# Patient Record
Sex: Female | Born: 1985
Health system: Southern US, Community
[De-identification: ages and names within clinical notes are randomized; demographics above are authoritative.]

## PROBLEM LIST (undated history)

## (undated) DIAGNOSIS — Z8742 Personal history of other diseases of the female genital tract: Secondary | ICD-10-CM

## (undated) DIAGNOSIS — Z4509 Encounter for adjustment and management of other cardiac device: Secondary | ICD-10-CM

## (undated) DIAGNOSIS — Z95818 Presence of other cardiac implants and grafts: Secondary | ICD-10-CM

## (undated) DIAGNOSIS — Z5189 Encounter for other specified aftercare: Secondary | ICD-10-CM

## (undated) DIAGNOSIS — O2240 Hemorrhoids in pregnancy, unspecified trimester: Secondary | ICD-10-CM

## (undated) DIAGNOSIS — I499 Cardiac arrhythmia, unspecified: Secondary | ICD-10-CM

## (undated) DIAGNOSIS — K219 Gastro-esophageal reflux disease without esophagitis: Secondary | ICD-10-CM

## (undated) DIAGNOSIS — F419 Anxiety disorder, unspecified: Secondary | ICD-10-CM

## (undated) DIAGNOSIS — H9190 Unspecified hearing loss, unspecified ear: Secondary | ICD-10-CM

## (undated) DIAGNOSIS — F329 Major depressive disorder, single episode, unspecified: Secondary | ICD-10-CM

## (undated) DIAGNOSIS — F32A Depression, unspecified: Secondary | ICD-10-CM

## (undated) DIAGNOSIS — I341 Nonrheumatic mitral (valve) prolapse: Secondary | ICD-10-CM

## (undated) HISTORY — DX: Nonrheumatic mitral (valve) prolapse: I34.1

---
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
## 2018-03-14 DIAGNOSIS — Z95818 Presence of other cardiac implants and grafts: Secondary | ICD-10-CM

## 2018-03-14 HISTORY — DX: Presence of other cardiac implants and grafts: Z95.818

## 2018-06-20 LAB — OB RESULTS CONSOLE HIV ANTIBODY (ROUTINE TESTING): HIV: NONREACTIVE

## 2018-06-20 LAB — OB RESULTS CONSOLE RUBELLA ANTIBODY, IGM: Rubella: IMMUNE

## 2018-06-20 LAB — OB RESULTS CONSOLE GC/CHLAMYDIA
Chlamydia: NEGATIVE
Gonorrhea: NEGATIVE

## 2018-09-07 LAB — OB RESULTS CONSOLE RPR: RPR: NONREACTIVE

## 2018-10-25 ENCOUNTER — Other Ambulatory Visit: Payer: Self-pay | Admitting: Obstetrics

## 2018-10-26 LAB — OB RESULTS CONSOLE GBS: GBS: NEGATIVE

## 2018-10-31 ENCOUNTER — Encounter (HOSPITAL_COMMUNITY): Payer: Self-pay | Admitting: *Deleted

## 2018-11-05 ENCOUNTER — Encounter (HOSPITAL_COMMUNITY): Payer: Self-pay

## 2018-11-13 ENCOUNTER — Encounter (HOSPITAL_COMMUNITY)
Admission: RE | Admit: 2018-11-13 | Discharge: 2018-11-13 | Disposition: A | Discharge status: Home / Self Care | Payer: 59 | Source: Ambulatory Visit | Attending: Obstetrics | Admitting: Obstetrics

## 2018-11-13 HISTORY — DX: Hemorrhoids in pregnancy, unspecified trimester: O22.40

## 2018-11-13 HISTORY — DX: Anxiety disorder, unspecified: F41.9

## 2018-11-13 HISTORY — DX: Cardiac arrhythmia, unspecified: I49.9

## 2018-11-13 HISTORY — DX: Encounter for other specified aftercare: Z51.89

## 2018-11-13 HISTORY — DX: Unspecified hearing loss, unspecified ear: H91.90

## 2018-11-13 HISTORY — DX: Depression, unspecified: F32.A

## 2018-11-13 HISTORY — DX: Personal history of other diseases of the female genital tract: Z87.42

## 2018-11-13 HISTORY — DX: Major depressive disorder, single episode, unspecified: F32.9

## 2018-11-13 HISTORY — DX: Gastro-esophageal reflux disease without esophagitis: K21.9

## 2018-11-13 LAB — CBC WITH DIFFERENTIAL/PLATELET
Basophils Absolute: 0 10*3/uL (ref 0.0–0.1)
Basophils Relative: 0 %
Eosinophils Absolute: 0.1 10*3/uL (ref 0.0–0.5)
Eosinophils Relative: 1 %
HCT: 38.5 % (ref 36.0–46.0)
Hemoglobin: 12.4 g/dL (ref 12.0–15.0)
Lymphocytes Relative: 23 %
Lymphs Abs: 2.5 10*3/uL (ref 0.7–4.0)
MCH: 30.8 pg (ref 26.0–34.0)
MCHC: 32.2 g/dL (ref 30.0–36.0)
MCV: 95.5 fL (ref 80.0–100.0)
Monocytes Absolute: 0.6 10*3/uL (ref 0.1–1.0)
Monocytes Relative: 5 %
Neutro Abs: 7.5 10*3/uL (ref 1.7–7.7)
Neutrophils Relative %: 71 %
Platelets: 239 10*3/uL (ref 150–400)
RBC: 4.03 MIL/uL (ref 3.87–5.11)
RDW: 13.5 % (ref 11.5–15.5)
WBC: 10.6 10*3/uL — ABNORMAL HIGH (ref 4.0–10.5)
nRBC: 0 % (ref 0.0–0.2)

## 2018-11-13 LAB — TYPE AND SCREEN
ABO/RH(D): O POS
Antibody Screen: NEGATIVE

## 2018-11-13 LAB — ABO/RH: ABO/RH(D): O POS

## 2018-11-13 NOTE — Anesthesia Preprocedure Evaluation (Addendum)
Anesthesia Evaluation  Patient identified by MRN, date of birth, ID band Patient awake    Reviewed: Allergy & Precautions, NPO status , Patient's Chart, lab work & pertinent test results  History of Anesthesia Complications Negative for: history of anesthetic complications  Airway Mallampati: II  TM Distance: >3 FB Neck ROM: Full    Dental  (+) Dental Advisory Given, Chipped,    Pulmonary neg pulmonary ROS,    Pulmonary exam normal breath sounds clear to auscultation       Cardiovascular Normal cardiovascular exam+ dysrhythmias  Rhythm:Regular Rate:Normal  Hx of SVT, has loop recorder in place   Neuro/Psych Anxiety Depression negative neurological ROS     GI/Hepatic Neg liver ROS, GERD  Controlled and Medicated,  Endo/Other  negative endocrine ROS  Renal/GU negative Renal ROS     Musculoskeletal negative musculoskeletal ROS (+)   Abdominal   Peds  Hematology negative hematology ROS (+)   Anesthesia Other Findings Day of surgery medications reviewed with the patient.  Reproductive/Obstetrics (+) Pregnancy Hx of C/S x1                            Anesthesia Physical Anesthesia Plan  ASA: II  Anesthesia Plan: Spinal   Post-op Pain Management:    Induction:   PONV Risk Score and Plan: 2 and Treatment may vary due to age or medical condition, Ondansetron and Dexamethasone  Airway Management Planned: Natural Airway  Additional Equipment:   Intra-op Plan:   Post-operative Plan:   Informed Consent: I have reviewed the patients History and Physical, chart, labs and discussed the procedure including the risks, benefits and alternatives for the proposed anesthesia with the patient or authorized representative who has indicated his/her understanding and acceptance.     Dental advisory given  Plan Discussed with: CRNA  Anesthesia Plan Comments:        Anesthesia Quick  Evaluation

## 2018-11-13 NOTE — Patient Instructions (Signed)
Kelly Haney  11/13/2018   Your procedure is scheduled on:  11/14/2018  Enter through the Main Entrance of [facility] at 1:30 PM.  Pick up the phone at the desk and dial [phone]  Call this number if you have problems the morning of surgery:[phone]  Remember:   Do not eat food:(After Midnight) Desps de medianoche.  Do not drink clear liquids: (6 Hours before arrival) 6 horas ante llegada.  Take these medicines the morning of surgery with A SIP OF WATER: take your zoloft as prescribed and you may take protonix if desired   Do not wear jewelry, make-up or nail polish.  Do not wear lotions, powders, or perfumes. Do not wear deodorant.  Do not shave 48 hours prior to surgery.  Do not bring valuables to the hospital.  [facility] is not   responsible for any belongings or valuables brought to the hospital.  Contacts, dentures or bridgework may not be worn into surgery.  Leave suitcase in the car. After surgery it may be brought to your room.  For patients admitted to the hospital, checkout time is 11:00 AM the day of              discharge.    N/A   Please read over the following fact sheets that you were given:   Surgical Site Infection Prevention

## 2018-11-14 ENCOUNTER — Inpatient Hospital Stay (HOSPITAL_COMMUNITY)
Admission: RE | Admit: 2018-11-14 | Discharge: 2018-11-17 | DRG: 787 | Disposition: A | Discharge status: Home / Self Care | Payer: 59 | Attending: Obstetrics | Admitting: Obstetrics

## 2018-11-14 ENCOUNTER — Inpatient Hospital Stay (HOSPITAL_COMMUNITY): Payer: 59 | Admitting: Anesthesiology

## 2018-11-14 ENCOUNTER — Inpatient Hospital Stay (HOSPITAL_COMMUNITY): Admission: RE | Admit: 2018-11-14 | Payer: 59 | Source: Home / Self Care | Admitting: Obstetrics

## 2018-11-14 ENCOUNTER — Encounter (HOSPITAL_COMMUNITY): Payer: Self-pay | Admitting: *Deleted

## 2018-11-14 ENCOUNTER — Encounter (HOSPITAL_COMMUNITY)
Admission: RE | Disposition: A | Discharge status: Home / Self Care | Payer: Self-pay | Source: Home / Self Care | Attending: Obstetrics

## 2018-11-14 DIAGNOSIS — O99344 Other mental disorders complicating childbirth: Secondary | ICD-10-CM | POA: Diagnosis present

## 2018-11-14 DIAGNOSIS — Z880 Allergy status to penicillin: Secondary | ICD-10-CM | POA: Diagnosis not present

## 2018-11-14 DIAGNOSIS — F329 Major depressive disorder, single episode, unspecified: Secondary | ICD-10-CM | POA: Diagnosis present

## 2018-11-14 DIAGNOSIS — I471 Supraventricular tachycardia: Secondary | ICD-10-CM | POA: Diagnosis present

## 2018-11-14 DIAGNOSIS — O34211 Maternal care for low transverse scar from previous cesarean delivery: Secondary | ICD-10-CM | POA: Diagnosis not present

## 2018-11-14 DIAGNOSIS — O9962 Diseases of the digestive system complicating childbirth: Secondary | ICD-10-CM | POA: Diagnosis present

## 2018-11-14 DIAGNOSIS — Z3A39 39 weeks gestation of pregnancy: Secondary | ICD-10-CM | POA: Diagnosis not present

## 2018-11-14 DIAGNOSIS — K219 Gastro-esophageal reflux disease without esophagitis: Secondary | ICD-10-CM | POA: Diagnosis present

## 2018-11-14 DIAGNOSIS — Z98891 History of uterine scar from previous surgery: Secondary | ICD-10-CM

## 2018-11-14 DIAGNOSIS — O9902 Anemia complicating childbirth: Secondary | ICD-10-CM | POA: Diagnosis present

## 2018-11-14 DIAGNOSIS — D649 Anemia, unspecified: Secondary | ICD-10-CM | POA: Diagnosis present

## 2018-11-14 LAB — RPR: RPR Ser Ql: NONREACTIVE

## 2018-11-14 SURGERY — Surgical Case
Anesthesia: Spinal | Site: Abdomen | Wound class: Clean Contaminated

## 2018-11-14 MED ORDER — SENNOSIDES-DOCUSATE SODIUM 8.6-50 MG PO TABS
2.0000 | ORAL_TABLET | ORAL | Status: DC
  Administered 2018-11-14 – 2018-11-16 (×2): 2 via ORAL
  Filled 2018-11-14 (×3): qty 2

## 2018-11-14 MED ORDER — DEXAMETHASONE SODIUM PHOSPHATE 10 MG/ML IJ SOLN
INTRAMUSCULAR | Status: AC
  Filled 2018-11-14: qty 1

## 2018-11-14 MED ORDER — NALBUPHINE HCL 10 MG/ML IJ SOLN: 5.0000 mg | INTRAMUSCULAR | Status: DC | PRN

## 2018-11-14 MED ORDER — SODIUM CHLORIDE 0.9 % IR SOLN
Status: DC | PRN
  Administered 2018-11-14: 1000 mL

## 2018-11-14 MED ORDER — SIMETHICONE 80 MG PO CHEW
80.0000 mg | CHEWABLE_TABLET | ORAL | Status: DC
  Administered 2018-11-14 – 2018-11-16 (×3): 80 mg via ORAL
  Filled 2018-11-14 (×3): qty 1

## 2018-11-14 MED ORDER — FENTANYL CITRATE (PF) 100 MCG/2ML IJ SOLN
25.0000 ug | INTRAMUSCULAR | Status: DC | PRN
  Administered 2018-11-14: 50 ug via INTRAVENOUS
  Administered 2018-11-14 (×2): 25 ug via INTRAVENOUS

## 2018-11-14 MED ORDER — DIPHENHYDRAMINE HCL 50 MG/ML IJ SOLN
12.5000 mg | INTRAMUSCULAR | Status: DC | PRN
  Administered 2018-11-14: 12.5 mg via INTRAVENOUS

## 2018-11-14 MED ORDER — IBUPROFEN 800 MG PO TABS
800.0000 mg | ORAL_TABLET | Freq: Three times a day (TID) | ORAL | Status: DC
  Administered 2018-11-15 – 2018-11-17 (×7): 800 mg via ORAL
  Filled 2018-11-14 (×7): qty 1

## 2018-11-14 MED ORDER — PRENATAL MULTIVITAMIN CH
1.0000 | ORAL_TABLET | Freq: Every day | ORAL | Status: DC
  Administered 2018-11-15 – 2018-11-17 (×3): 1 via ORAL
  Filled 2018-11-14 (×3): qty 1

## 2018-11-14 MED ORDER — OXYTOCIN 10 UNIT/ML IJ SOLN
INTRAMUSCULAR | Status: AC
  Filled 2018-11-14: qty 4

## 2018-11-14 MED ORDER — DIBUCAINE 1 % RE OINT: 1.0000 "application " | TOPICAL_OINTMENT | RECTAL | Status: DC | PRN

## 2018-11-14 MED ORDER — PANTOPRAZOLE SODIUM 40 MG PO TBEC
40.0000 mg | DELAYED_RELEASE_TABLET | Freq: Every day | ORAL | Status: DC
  Administered 2018-11-15 – 2018-11-16 (×2): 40 mg via ORAL
  Filled 2018-11-14 (×2): qty 1

## 2018-11-14 MED ORDER — NALOXONE HCL 4 MG/10ML IJ SOLN
1.0000 ug/kg/h | INTRAVENOUS | Status: DC | PRN
  Filled 2018-11-14: qty 5

## 2018-11-14 MED ORDER — BUPIVACAINE IN DEXTROSE 0.75-8.25 % IT SOLN
INTRATHECAL | Status: DC | PRN
  Administered 2018-11-14: 1.6 mL via INTRATHECAL

## 2018-11-14 MED ORDER — SODIUM CHLORIDE 0.9 % IV SOLN
INTRAVENOUS | Status: DC | PRN
  Administered 2018-11-14: 60 ug/min via INTRAVENOUS

## 2018-11-14 MED ORDER — LACTATED RINGERS IV SOLN
INTRAVENOUS | Status: DC
  Administered 2018-11-14: 23:00:00 via INTRAVENOUS

## 2018-11-14 MED ORDER — PHENYLEPHRINE 8 MG IN D5W 100 ML (0.08MG/ML) PREMIX OPTIME
INJECTION | INTRAVENOUS | Status: AC
  Filled 2018-11-14: qty 100

## 2018-11-14 MED ORDER — DIPHENHYDRAMINE HCL 50 MG/ML IJ SOLN
INTRAMUSCULAR | Status: AC
  Filled 2018-11-14: qty 1

## 2018-11-14 MED ORDER — FENTANYL CITRATE (PF) 100 MCG/2ML IJ SOLN
INTRAMUSCULAR | Status: AC
  Filled 2018-11-14: qty 2

## 2018-11-14 MED ORDER — MORPHINE SULFATE (PF) 0.5 MG/ML IJ SOLN
INTRAMUSCULAR | Status: DC | PRN
  Administered 2018-11-14: .15 mg via INTRATHECAL

## 2018-11-14 MED ORDER — SIMETHICONE 80 MG PO CHEW
80.0000 mg | CHEWABLE_TABLET | ORAL | Status: DC | PRN
  Administered 2018-11-17: 80 mg via ORAL

## 2018-11-14 MED ORDER — PROMETHAZINE HCL 25 MG/ML IJ SOLN: 6.2500 mg | INTRAMUSCULAR | Status: DC | PRN

## 2018-11-14 MED ORDER — MORPHINE SULFATE (PF) 0.5 MG/ML IJ SOLN
INTRAMUSCULAR | Status: AC
  Filled 2018-11-14: qty 10

## 2018-11-14 MED ORDER — ONDANSETRON HCL 4 MG/2ML IJ SOLN
INTRAMUSCULAR | Status: DC | PRN
  Administered 2018-11-14: 4 mg via INTRAVENOUS

## 2018-11-14 MED ORDER — SERTRALINE HCL 100 MG PO TABS
100.0000 mg | ORAL_TABLET | Freq: Every day | ORAL | Status: DC
  Administered 2018-11-15 – 2018-11-16 (×2): 100 mg via ORAL
  Filled 2018-11-14 (×4): qty 1

## 2018-11-14 MED ORDER — KETOROLAC TROMETHAMINE 30 MG/ML IJ SOLN: 30.0000 mg | Freq: Four times a day (QID) | INTRAMUSCULAR | Status: AC | PRN

## 2018-11-14 MED ORDER — ACETAMINOPHEN 500 MG PO TABS
1000.0000 mg | ORAL_TABLET | Freq: Four times a day (QID) | ORAL | Status: AC
  Administered 2018-11-14 – 2018-11-15 (×3): 1000 mg via ORAL
  Filled 2018-11-14 (×3): qty 2

## 2018-11-14 MED ORDER — OXYTOCIN 40 UNITS IN NORMAL SALINE INFUSION - SIMPLE MED
INTRAVENOUS | Status: DC | PRN
  Administered 2018-11-14: 40 mL via INTRAVENOUS

## 2018-11-14 MED ORDER — FENTANYL CITRATE (PF) 100 MCG/2ML IJ SOLN
INTRAMUSCULAR | Status: DC | PRN
  Administered 2018-11-14: 15 ug via INTRATHECAL

## 2018-11-14 MED ORDER — WITCH HAZEL-GLYCERIN EX PADS: 1.0000 "application " | MEDICATED_PAD | CUTANEOUS | Status: DC | PRN

## 2018-11-14 MED ORDER — OXYCODONE HCL 5 MG PO TABS
5.0000 mg | ORAL_TABLET | ORAL | Status: DC | PRN
  Administered 2018-11-15: 10 mg via ORAL
  Administered 2018-11-15 (×2): 5 mg via ORAL
  Administered 2018-11-16 (×2): 10 mg via ORAL
  Administered 2018-11-16 – 2018-11-17 (×2): 5 mg via ORAL
  Filled 2018-11-14 (×6): qty 1
  Filled 2018-11-14 (×2): qty 2

## 2018-11-14 MED ORDER — MENTHOL 3 MG MT LOZG: 1.0000 | LOZENGE | OROMUCOSAL | Status: DC | PRN

## 2018-11-14 MED ORDER — DEXAMETHASONE SODIUM PHOSPHATE 4 MG/ML IJ SOLN
INTRAMUSCULAR | Status: DC | PRN
  Administered 2018-11-14: 8 mg via INTRAVENOUS

## 2018-11-14 MED ORDER — DIPHENHYDRAMINE HCL 25 MG PO CAPS
25.0000 mg | ORAL_CAPSULE | ORAL | Status: DC | PRN
  Filled 2018-11-14: qty 1

## 2018-11-14 MED ORDER — SIMETHICONE 80 MG PO CHEW
80.0000 mg | CHEWABLE_TABLET | Freq: Three times a day (TID) | ORAL | Status: DC
  Administered 2018-11-15 – 2018-11-16 (×6): 80 mg via ORAL
  Filled 2018-11-14 (×7): qty 1

## 2018-11-14 MED ORDER — CEFAZOLIN SODIUM-DEXTROSE 2-4 GM/100ML-% IV SOLN
2.0000 g | INTRAVENOUS | Status: AC
  Administered 2018-11-14: 2 g via INTRAVENOUS
  Filled 2018-11-14: qty 100

## 2018-11-14 MED ORDER — SODIUM CHLORIDE 0.9% FLUSH: 3.0000 mL | INTRAVENOUS | Status: DC | PRN

## 2018-11-14 MED ORDER — DIPHENHYDRAMINE HCL 25 MG PO CAPS: 25.0000 mg | ORAL_CAPSULE | Freq: Four times a day (QID) | ORAL | Status: DC | PRN

## 2018-11-14 MED ORDER — COCONUT OIL OIL
1.0000 "application " | TOPICAL_OIL | Status: DC | PRN
  Administered 2018-11-15: 1 via TOPICAL
  Filled 2018-11-14: qty 120

## 2018-11-14 MED ORDER — KETOROLAC TROMETHAMINE 30 MG/ML IJ SOLN
INTRAMUSCULAR | Status: AC
  Filled 2018-11-14: qty 1

## 2018-11-14 MED ORDER — KETOROLAC TROMETHAMINE 30 MG/ML IJ SOLN: 30.0000 mg | Freq: Once | INTRAMUSCULAR | Status: DC | PRN

## 2018-11-14 MED ORDER — NALOXONE HCL 0.4 MG/ML IJ SOLN: 0.4000 mg | INTRAMUSCULAR | Status: DC | PRN

## 2018-11-14 MED ORDER — NALBUPHINE HCL 10 MG/ML IJ SOLN: 5.0000 mg | Freq: Once | INTRAMUSCULAR | Status: DC | PRN

## 2018-11-14 MED ORDER — STERILE WATER FOR IRRIGATION IR SOLN
Status: DC | PRN
  Administered 2018-11-14: 1000 mL

## 2018-11-14 MED ORDER — ONDANSETRON HCL 4 MG/2ML IJ SOLN
INTRAMUSCULAR | Status: AC
  Filled 2018-11-14: qty 2

## 2018-11-14 MED ORDER — ONDANSETRON HCL 4 MG/2ML IJ SOLN: 4.0000 mg | Freq: Three times a day (TID) | INTRAMUSCULAR | Status: DC | PRN

## 2018-11-14 MED ORDER — OXYTOCIN 40 UNITS IN NORMAL SALINE INFUSION - SIMPLE MED: 2.5000 [IU]/h | INTRAVENOUS | Status: AC

## 2018-11-14 MED ORDER — ZOLPIDEM TARTRATE 5 MG PO TABS: 5.0000 mg | ORAL_TABLET | Freq: Every evening | ORAL | Status: DC | PRN

## 2018-11-14 MED ORDER — LACTATED RINGERS IV SOLN
INTRAVENOUS | Status: DC
  Administered 2018-11-14 (×2): via INTRAVENOUS

## 2018-11-14 MED ORDER — KETOROLAC TROMETHAMINE 30 MG/ML IJ SOLN
30.0000 mg | Freq: Four times a day (QID) | INTRAMUSCULAR | Status: AC | PRN
  Administered 2018-11-14: 30 mg via INTRAMUSCULAR

## 2018-11-14 MED ORDER — TETANUS-DIPHTH-ACELL PERTUSSIS 5-2.5-18.5 LF-MCG/0.5 IM SUSP: 0.5000 mL | Freq: Once | INTRAMUSCULAR | Status: DC

## 2018-11-14 MED ORDER — LACTATED RINGERS IV SOLN
INTRAVENOUS | Status: DC | PRN
  Administered 2018-11-14: 16:00:00 via INTRAVENOUS

## 2018-11-14 SURGICAL SUPPLY — 26 items
BENZOIN TINCTURE PRP APPL 2/3 (GAUZE/BANDAGES/DRESSINGS) ×2 IMPLANT
CHLORAPREP W/TINT 26ML (MISCELLANEOUS) ×2 IMPLANT
CLAMP CORD UMBIL (MISCELLANEOUS) ×2 IMPLANT
CLOTH BEACON ORANGE TIMEOUT ST (SAFETY) ×2 IMPLANT
DRSG OPSITE POSTOP 4X10 (GAUZE/BANDAGES/DRESSINGS) ×2 IMPLANT
ELECT REM PT RETURN 9FT ADLT (ELECTROSURGICAL) ×2
ELECTRODE REM PT RTRN 9FT ADLT (ELECTROSURGICAL) ×1 IMPLANT
GLOVE BIO SURGEON STRL SZ 6.5 (GLOVE) ×2 IMPLANT
GLOVE BIOGEL PI IND STRL 7.0 (GLOVE) ×2 IMPLANT
GLOVE BIOGEL PI INDICATOR 7.0 (GLOVE) ×2
GOWN STRL REUS W/TWL LRG LVL3 (GOWN DISPOSABLE) ×4 IMPLANT
NS IRRIG 1000ML POUR BTL (IV SOLUTION) ×2 IMPLANT
PACK C SECTION WH (CUSTOM PROCEDURE TRAY) ×2 IMPLANT
PAD OB MATERNITY 4.3X12.25 (PERSONAL CARE ITEMS) ×2 IMPLANT
PENCIL SMOKE EVAC W/HOLSTER (ELECTROSURGICAL) ×2 IMPLANT
SPONGE LAP 18X18 RF (DISPOSABLE) ×6 IMPLANT
STRIP CLOSURE SKIN 1/2X4 (GAUZE/BANDAGES/DRESSINGS) ×2 IMPLANT
SUT MON AB 4-0 PS1 27 (SUTURE) ×2 IMPLANT
SUT PLAIN 2 0 XLH (SUTURE) ×2 IMPLANT
SUT VIC AB 0 CT1 36 (SUTURE) ×4 IMPLANT
SUT VIC AB 0 CTX 36 (SUTURE) ×3
SUT VIC AB 0 CTX36XBRD ANBCTRL (SUTURE) ×3 IMPLANT
SUT VIC AB 2-0 CT1 27 (SUTURE) ×2
SUT VIC AB 2-0 CT1 TAPERPNT 27 (SUTURE) ×2 IMPLANT
TOWEL OR 17X24 6PK STRL BLUE (TOWEL DISPOSABLE) ×2 IMPLANT
TRAY FOLEY W/BAG SLVR 14FR LF (SET/KITS/TRAYS/PACK) ×2 IMPLANT

## 2018-11-14 NOTE — Lactation Note (Signed)
This note was copied from a baby's chart. Lactation Consultation Note  Patient Name: Kelly Haney MIWOE'H Date: 11/14/2018 Reason for consult: Initial assessment;Term  4 hours old FT female who is being exclusively BF by his mother, she's a P3 and experienced BF. She was able to BF her first child for 19 months, and experienced oversupply issues. She BF her second one for about 14 months until she got pregnant with this baby and this around around she managed to keep her oversupply under control, but baby had a lip tie that went undiagnosed until 43 months of age; mom experienced a lot of nipple pain. She didn't participated in the [facility] program but she's interested in applying, mom lives in [place], let her know that the [facility] staff comes to the hospital to visit mother's rooms, RN notified.  Mom is familiar with hand expression and already getting colostrum, she voiced to [facility] that she's been leaking since 35-36 weeks of pregnancy. She's currently on Zolof, an L3 Limited data-probably compatible with BF according to Hale's book "Medications and Mother's milk". Discussed cluster feeding and normal newborn behavior, mom had lots of questions and was very involved in [facility] consultation. Baby not in the room at the time of consult, he was taken tot the nursery for observation. Asked mom to call for assistance when needed.  Feeding plan:  1. Encouraged mom to feed baby STS 8-12 times/24 hours or sooner if feeding cues are present 2. Hand expression and spoon feeding was also encouraged  BF brochure, BF resources and feeding diary were reviewed. Mom reported all questions and concerns were answered, she's aware of LC services and will call PRN.   Maternal Data Formula Feeding for Exclusion: No Has patient been taught Hand Expression?: Yes Does the patient have breastfeeding experience prior to this delivery?: Yes  Feeding Feeding Type: Breast Fed  LATCH Score Latch: Grasps breast easily,  tongue down, lips flanged, rhythmical sucking.  Audible Swallowing: A few with stimulation  Type of Nipple: Everted at rest and after stimulation  Comfort (Breast/Nipple): Soft / non-tender  Hold (Positioning): No assistance needed to correctly position infant at breast.  LATCH Score: 9  Interventions Interventions: Breast feeding basics reviewed  Lactation Tools Discussed/Used WIC Program: No   Consult Status Consult Status: Follow-up Date: 11/15/18 Follow-up type: In-patient    Katara Griner Venetia Constable 11/14/2018, 8:36 PM

## 2018-11-14 NOTE — Anesthesia Procedure Notes (Signed)
Spinal  Patient location during procedure: OR Start time: 11/14/2018 3:01 PM End time: 11/14/2018 3:03 PM Staffing Anesthesiologist: [name], MD Performed: anesthesiologist  Preanesthetic Checklist Completed: patient identified, site marked, pre-op evaluation, timeout performed, IV checked, risks and benefits discussed and monitors and equipment checked Spinal Block Patient position: sitting Prep: ChloraPrep Patient monitoring: heart rate, cardiac monitor and continuous pulse ox Approach: midline Location: L3-4 Injection technique: single-shot Needle Needle type: Pencan  Needle gauge: 24 G Needle length: 10 cm Additional Notes Risks, benefits, and alternative discussed. Patient gave consent to procedure. Prepped and draped in sitting position. Clear CSF obtained after one needle pass. Positive terminal aspiration. No pain or paraesthesias with injection. Patient tolerated procedure well. Vital signs stable. Kelly Greenhouse, MD

## 2018-11-14 NOTE — H&P (Signed)
Kelly Haney is a 33 y.o. G6P0030 at [redacted]w[redacted]d presenting for repeat cesarean section. Pt notes rare contractions . Good fetal movement, No vaginal bleeding, not leaking fluid .  PNCare at [facility] since 18 Wks -Transfer of care at 18 weeks due to moved from [place].  Adequate prenatal care prior.  Move due to husband joining local police [employer]. -Prior cesarean section x2.  For repeat cesarean section. -Supraventricular tachycardia.  Loop recorder in place.  Following with Dr. Jacinto Halim. -Rash with penicillin.  Has tolerated cephalosporins prior -Depression, financial concerns, on Zoloft -GERD, on Protonix   Prenatal Transfer Tool  Maternal Diabetes: No Genetic Screening: Normal Maternal Ultrasounds/Referrals: Normal Fetal Ultrasounds or other Referrals:  None Maternal Substance Abuse:  No Significant Maternal Medications:  None Significant Maternal Lab Results: None     OB History    Gravida  6   Para  2   Term      Preterm      AB  3   Living        SAB  3   TAB      Ectopic      Multiple      Live Births             Past Medical History:  Diagnosis Date  . Anxiety   . Blood transfusion without reported diagnosis   . Depression    zoloft  . Dysrhythmia    SVT has loop recorder in place  . GERD (gastroesophageal reflux disease)   . Hearing loss    right ear  . Hemorrhoids during pregnancy   . History of endometriosis    Past Surgical History:  Procedure Laterality Date  . CESAREAN SECTION     Family History: family history includes Esophageal cancer in her father; Heart disease in her mother. Social History:  reports that she has never smoked. She has never used smokeless tobacco. She reports previous alcohol use. She reports that she does not use drugs.  Review of Systems - Negative except Discomfort of pregnancy     Blood pressure (!) 144/88, pulse (!) 104, temperature 98 F (36.7 C), temperature source Oral, resp. rate 20, height 5'  1" (1.549 m), weight 72.6 kg.  Physical Exam:  Gen: well appearing, no distress  Back: no CVAT Abd: gravid, NT, no RUQ pain LE: Trace edema, equal bilaterally, non-tender Fetal heart tones per nurse  Prenatal labs: ABO, Rh: --/--/O POS, O POS Performed at [facility], [address], [place], [place] [address]  [phone]/21 1109) Antibody: NEG (01/21 1109) Rubella: Immune (08/28 0000) RPR: Non Reactive (01/21 1109)  HBsAg:   Negative HIV: Non-reactive (08/28 0000)  GBS: Negative (01/03 0000)  1 hr Glucola 87  Genetic screening normal cell free DNA Anatomy US normal   CBC    Component Value Date/Time   WBC 10.6 (H) 11/13/2018 1109   RBC 4.03 11/13/2018 1109   HGB 12.4 11/13/2018 1109   HCT 38.5 11/13/2018 1109   PLT 239 11/13/2018 1109   MCV 95.5 11/13/2018 1109   MCH 30.8 11/13/2018 1109   MCHC 32.2 11/13/2018 1109   RDW 13.5 11/13/2018 1109   LYMPHSABS 2.5 11/13/2018 1109   MONOABS 0.6 11/13/2018 1109   EOSABS 0.1 11/13/2018 1109   BASOSABS 0.0 11/13/2018 1109    Assessment/Plan: 33 y.o. G6P0030 at [redacted]w[redacted]d Prior cesarean section x2.  Risk benefits discussed with patient plan third cesarean section today -Social work consult for financial strain -Depression.  Continue  Zoloft postpartum   Lendon Colonel 11/14/2018, 2:35 PM

## 2018-11-14 NOTE — Op Note (Signed)
11/14/2018  5:15 PM  PATIENT:  Kelly Haney  33 y.o. female  PRE-OPERATIVE DIAGNOSIS:  Previous Cesarean Section  POST-OPERATIVE DIAGNOSIS:  Previous Cesarean Section  PROCEDURE:  Procedure(s) with comments: Repeat CESAREAN SECTION (N/A) - EDD: 11/19/18 Allergy: Shellfish, Penicillin, Amoxicillin Repeat low transverse cesarean section with 2 layer closure Lysis of adhesions  SURGEON:  Surgeon(s) and Role:    Noland Fordyce, MD - Primary  PHYSICIAN ASSISTANT:   ASSISTANTS: Carlean Jews, CNM  ANESTHESIA:   spinal  EBL:  425 mL   BLOOD ADMINISTERED:none  DRAINS: Urinary Catheter (Foley)   LOCAL MEDICATIONS USED:  NONE  SPECIMEN:  Source of Specimen:  Placenta  DISPOSITION OF SPECIMEN:  Labor and delivery  COUNTS:  YES  TOURNIQUET:  * No tourniquets in log *  DICTATION: .Note written in EPIC  PLAN OF CARE: Admit to inpatient   PATIENT DISPOSITION:  PACU - hemodynamically stable.   Delay start of Pharmacological VTE agent (>24hrs) due to surgical blood loss or risk of bleeding: yes     Findings:  @BABYSEXEBC @ infant,  APGAR (1 MIN): 8   APGAR (5 MINS): 9   APGAR (10 MINS):   Normal uterus, tubes and ovaries, normal placenta. 3VC, clear amniotic fluid  EBL: Per nursing notes cc Antibiotics:   2g Ancef Complications: none  Indications: This is a 33 y.o. year-old, G3, P2 at [redacted]w[redacted]d admitted for repeat cesarean section. Risks benefits and alternatives of the procedure were discussed with the patient who agreed to proceed  Procedure:  After informed consent was obtained the patient was taken to the operating room where spinal anesthesia was initiated.  She was prepped and draped in the normal sterile fashion in dorsal supine position with a leftward tilt.  A foley catheter was in place.  A Pfannenstiel skin incision was made 2 cm above the pubic symphysis in the midline with the scalpel.  Dissection was carried down with the Bovie cautery until the fascia was  reached. The fascia was incised in the midline. The incision was extended laterally with the Mayo scissors. The inferior aspect of the fascial incision was grasped with the Coker clamps, elevated up and the underlying rectus muscles were dissected off sharply. The superior aspect of the fascial incision was grasped with the Coker clamps elevated up and the underlying rectus muscles were dissected off sharply.  The peritoneum was entered sharply due to noted adhesions.  The bladder was noted to be high to the uterus.. The peritoneal incision was not able to be extended due to adhesions. The bladder was taken down sharply through a small window and what was thought to be the peritoneum.  After the bladder was taken down further  exploration and dissection in order to develop normal planes was done.  Eventually after careful dissection I noted the uterine serosa was densely adherent to the anterior abdominal wall.  The narrow window in which we were operating through was actually the uterine serosa.  This explained the inability to extend the operating window.  With continued focus on evaluating the bladder borders, the peritoneal window was opened and the serosa was dissected from the anterior abdominal wall.  The bladder blade was inserted and palpation was done to assess the fetal position and the location of the uterine vessels. The lower segment of the uterus was incised sharply with the scalpel.  The uterine muscle was very thick and a low placenta was encountered.  The uterine incision was extended bluntly.  Continued incision through  the placenta was done.  Clear amniotic fluid was noted. The infant was grasped, brought to the incision,  rotated and the infant was delivered with fundal pressure. The nose and mouth were bulb suctioned. The cord was clamped and cut after 30 seconds due to poor fetal tone.  The infant was handed off to the waiting pediatrician. The placenta was expressed. The uterus was  exteriorized. The uterus was cleared of all clots and debris. The uterine incision was repaired with 0 Vicryl in a running locked fashion.  A second layer of the same suture was used in an imbricating fashion to obtain excellent hemostasis.  Several additional figure-of-eight sutures were used for hemostasis.  the uterus was then returned to the abdomen, the gutters were cleared of all clots and debris. The uterine incision was reinspected and found to be hemostatic.  The serosal layer that had been opened was closed with 2-0 Vicryl.  The peritoneum was grasped and closed with 2-0 Vicryl in a running fashion. The cut muscle edges and the underside of the fascia were inspected and found to be hemostatic. The fascia was closed with 0 Vicryl in 2 halves. The subcutaneous tissue was irrigated. Scarpa's layer was closed with a 2-0 plain gut suture. The skin was closed with a 4-0 Monocryl on a Keith needle. The patient tolerated the procedure well. Sponge lap and needle counts were correct x3 and patient was taken to the recovery room in a stable condition.  Kelly Haney 11/14/2018 5:17 PM

## 2018-11-14 NOTE — Transfer of Care (Signed)
Immediate Anesthesia Transfer of Care Note  Patient: Kelly Haney  Procedure(s) Performed: Repeat CESAREAN SECTION (N/A Abdomen)  Patient Location: PACU  Anesthesia Type:Spinal  Level of Consciousness: awake alert oriented x3  Airway & Oxygen Therapy: Patient Spontanous Breathing and Patient connected to nasal cannula oxygen  Post-op Assessment: Report given to RN and Post -op Vital signs reviewed and stable  Post vital signs: Reviewed and stable  Last Vitals:  Vitals Value Taken Time  BP    Temp    Pulse    Resp    SpO2      Last Pain:  Vitals:   11/14/18 1350  TempSrc: Oral  PainSc: 10-Worst pain ever         Complications: No apparent anesthesia complications

## 2018-11-14 NOTE — Brief Op Note (Signed)
11/14/2018  5:15 PM  PATIENT:  Kelly Haney  33 y.o. female  PRE-OPERATIVE DIAGNOSIS:  Previous Cesarean Section  POST-OPERATIVE DIAGNOSIS:  Previous Cesarean Section  PROCEDURE:  Procedure(s) with comments: Repeat CESAREAN SECTION (N/A) - EDD: 11/19/18 Allergy: Shellfish, Penicillin, Amoxicillin Repeat low transverse cesarean section with 2 layer closure Lysis of adhesions  SURGEON:  Surgeon(s) and Role:    Noland Fordyce, MD - Primary  PHYSICIAN ASSISTANT:   ASSISTANTS: Carlean Jews, CNM  ANESTHESIA:   spinal  EBL:  425 mL   BLOOD ADMINISTERED:none  DRAINS: Urinary Catheter (Foley)   LOCAL MEDICATIONS USED:  NONE  SPECIMEN:  Source of Specimen:  Placenta  DISPOSITION OF SPECIMEN:  Labor and delivery  COUNTS:  YES  TOURNIQUET:  * No tourniquets in log *  DICTATION: .Note written in EPIC  PLAN OF CARE: Admit to inpatient   PATIENT DISPOSITION:  PACU - hemodynamically stable.   Delay start of Pharmacological VTE agent (>24hrs) due to surgical blood loss or risk of bleeding: yes

## 2018-11-15 ENCOUNTER — Encounter (HOSPITAL_COMMUNITY): Payer: Self-pay | Admitting: Obstetrics

## 2018-11-15 LAB — CBC
HCT: 27.6 % — ABNORMAL LOW (ref 36.0–46.0)
Hemoglobin: 9 g/dL — ABNORMAL LOW (ref 12.0–15.0)
MCH: 30.7 pg (ref 26.0–34.0)
MCHC: 32.6 g/dL (ref 30.0–36.0)
MCV: 94.2 fL (ref 80.0–100.0)
Platelets: 197 10*3/uL (ref 150–400)
RBC: 2.93 MIL/uL — ABNORMAL LOW (ref 3.87–5.11)
RDW: 13.5 % (ref 11.5–15.5)
WBC: 16.1 10*3/uL — ABNORMAL HIGH (ref 4.0–10.5)
nRBC: 0 % (ref 0.0–0.2)

## 2018-11-15 NOTE — Lactation Note (Signed)
This note was copied from a baby's chart. Lactation Consultation Note  Patient Name: Boy Deniah Tabet RCVEL'F Date: 11/15/2018 Reason for consult: Follow-up assessment;Infant weight loss;Other (Comment);Nipple pain/trauma;Term(exp BF x 2 -  3rd baby, / mom expressed it has been a long day and she is very tired / LC enc communication with RN to give her rest - see LC note )  Baby is 67 hours old  LC reviewed doc flow sheets and by the assessment of the doc flow sheets baby has his nights and days switched around.  Mom also mentioned it. Mom aware to place baby STS about every 2-3 hours , hand express, call for assistance.  Per mom has sore cracked nipples with scabs / LC offered to assess and mom declined at this time.  Per mom the nurse gave her coconut oil and she has been using the EBM to nipples due leakage.  Mom requested to have LC see her tomorrow. Mom shared it has been a long day / baby was up all night/ she has passed several  Clots ( which the RN is aware ) and has had gas in her shoulders.   Due to mom expressing she is so exhausted and tired/ and expressing dad won't be able to stay the night due  To work in the am /  LC recommended having her RN take baby to the nursery so she can obtain solid rest.     Maternal Data Has patient been taught Hand Expression?: (LC enc EBM to nipples for scabs and cracking / mom declined LC assessment - also has coconut oil / declined comfort gels )  Feeding Feeding Type: (per mom plans to feed in a little bit / felt she didn't need help / enc to call if needed )  LATCH Score                   Interventions Interventions: Breast feeding basics reviewed;Coconut oil  Lactation Tools Discussed/Used Tools: Coconut oil   Consult Status Consult Status: Follow-up(mom asked for [facility] check tomorrow ) Date: 11/16/18 Follow-up type: In-patient    Matilde Sprang Roen Macgowan 11/15/2018, 7:34 PM

## 2018-11-15 NOTE — Anesthesia Postprocedure Evaluation (Signed)
Anesthesia Post Note  Patient: Kelly BradfordSusan Haney  Procedure(s) Performed: Repeat CESAREAN SECTION (N/A Abdomen)     Patient location during evaluation: Mother Baby Anesthesia Type: Spinal Level of consciousness: awake, awake and alert and oriented Pain management: pain level controlled Vital Signs Assessment: post-procedure vital signs reviewed and stable Respiratory status: spontaneous breathing and respiratory function stable Cardiovascular status: blood pressure returned to baseline Postop Assessment: no headache, no backache, spinal receding, patient able to bend at knees, no apparent nausea or vomiting, adequate PO intake and able to ambulate Anesthetic complications: no    Last Vitals:  Vitals:   11/15/18 0055 11/15/18 0500  BP: 115/64 115/79  Pulse: 66 73  Resp: 18 16  Temp: 37 C 36.6 C  SpO2: 96% 97%    Last Pain:  Vitals:   11/15/18 0507  TempSrc:   PainSc: 4    Pain Goal:                   Cleda ClarksBrowder, Camdyn Beske R

## 2018-11-15 NOTE — Progress Notes (Deleted)
MOB was referred for history of depression/anxiety. * Referral screened out by Clinical Social Worker because none of the following criteria appear to apply: ~ History of anxiety/depression during this pregnancy, or of post-partum depression following prior delivery. ~ Diagnosis of anxiety and/or depression within last 3 years OR * MOB's symptoms currently being treated with medication and/or therapy. Please contact the Clinical Social Worker if needs arise, by MOB request, or if MOB scores greater than 9/yes to question 10 on Edinburgh Postpartum Depression Screen.  Tonesha Tsou, LCSW Clinical Social Worker  [facility]  [phone]  

## 2018-11-15 NOTE — Anesthesia Postprocedure Evaluation (Signed)
Anesthesia Post Note  Patient: Kelly Haney  Procedure(s) Performed: Repeat CESAREAN SECTION (N/A Abdomen)     Patient location during evaluation: PACU Anesthesia Type: Spinal Level of consciousness: awake and alert Pain management: pain level controlled Vital Signs Assessment: post-procedure vital signs reviewed and stable Respiratory status: spontaneous breathing, nonlabored ventilation and respiratory function stable Cardiovascular status: blood pressure returned to baseline and stable Postop Assessment: no apparent nausea or vomiting and spinal receding Anesthetic complications: no    Last Vitals:  Vitals:   11/15/18 0055 11/15/18 0500  BP: 115/64 115/79  Pulse: 66 73  Resp: 18 16  Temp: 37 C 36.6 C  SpO2: 96% 97%    Last Pain:  Vitals:   11/15/18 0507  TempSrc:   PainSc: 4                  [name]

## 2018-11-15 NOTE — Addendum Note (Signed)
Addendum  created 11/15/18 7096 by Cleda Clarks, CRNA   Clinical Note Signed

## 2018-11-15 NOTE — Progress Notes (Signed)
CSW attempted to meet with MOB via bedside however multiple RN's at bedside. Will come back at later time.   Stacy Gardner, LCSW Clinical Social Worker  [facility] [employer]  [phone]

## 2018-11-15 NOTE — Progress Notes (Signed)
POD# 1  S: Pt notes pain controlled w/ po meds, minimal lochia, Foley still in place, out of bed w/o dizziness or chest pain, tol reg po, + flatus. Pt is  Breastfeeding though frustrating with baby cluster feeding overnight.  Vitals:   11/14/18 1945 11/14/18 2100 11/15/18 0055 11/15/18 0500  BP: 140/90 131/90 115/64 115/79  Pulse: 75 74 66 73  Resp: 18 16 18 16   Temp: 98.1 F (36.7 C) 98.6 F (37 C) 98.6 F (37 C) 97.8 F (36.6 C)  TempSrc: Oral Oral Oral Axillary  SpO2: 98% 97% 96% 97%  Weight:      Height:        Gen: well appearing CV: RRR Pulm: CTAB Abd: soft, ND, approp tender, fundus below umbilicus, NT Inc: C/D/I,  LE: tr edema, NT  CBC    Component Value Date/Time   WBC 16.1 (H) 11/15/2018 0557   RBC 2.93 (L) 11/15/2018 0557   HGB 9.0 (L) 11/15/2018 0557   HCT 27.6 (L) 11/15/2018 0557   PLT 197 11/15/2018 0557   MCV 94.2 11/15/2018 0557   MCH 30.7 11/15/2018 0557   MCHC 32.6 11/15/2018 0557   RDW 13.5 11/15/2018 0557   LYMPHSABS 2.5 11/13/2018 1109   MONOABS 0.6 11/13/2018 1109   EOSABS 0.1 11/13/2018 1109   BASOSABS 0.0 11/13/2018 1109    A/P: POD#1 s/p repeat cesarean section - post-op. Doing well.  -Mild anemia.  Asymptomatic.  Will defer meds at this time Consented for circumcision  Kelly Haney 11/15/2018 7:27 AM

## 2018-11-15 NOTE — Progress Notes (Signed)
I called & notified Daniella PaulCNM, Dr Billy Coast and Montez Morita RN, RR of a grapefruit size clot 40% protruding from patient's vaginal area. Patient's VS are stable, FF 1 below umbilicus.

## 2018-11-15 NOTE — Progress Notes (Addendum)
Late entry note:  Pt is POD 1 s/p uncomplicated repeat cesarean section.  Called by RN on MBU at 1440 for a "grapefruit " sized clot at perineum. RN reported no active bleeding, nl VS and no patient discomfort. Related to the RN that patient would be evaluated when CNM was done in surgery unless situation was emergent. No orders given at that time.  Called at 1500 by Rapid response RN to come evaluate patient for similar concern. Pt was uncomfortable and concerned.  Arrived to see pt at 1515. VS were stable.   Inspection reveals 4x2 cm clot wrapped in amnion at introitus. Clot removed. NO active bleeding noted. Fundus NT and firm. Bimanual exam reveals minimal blood in vaginal vault.  Will continue to monitor. NO further orders given.

## 2018-11-15 NOTE — Progress Notes (Signed)
Dr. Billy Coast at pt bedside. Orange size clot manually removed from vagina by Dr. Billy Coast.  Pt tolerated procedure well. No new orders. Pt stable. Vital signs within normal limits and noted. Will continue to monitor pt.

## 2018-11-16 ENCOUNTER — Encounter (HOSPITAL_COMMUNITY): Payer: Self-pay | Admitting: *Deleted

## 2018-11-16 DIAGNOSIS — O9902 Anemia complicating childbirth: Secondary | ICD-10-CM | POA: Diagnosis not present

## 2018-11-16 MED ORDER — POLYSACCHARIDE IRON COMPLEX 150 MG PO CAPS
150.0000 mg | ORAL_CAPSULE | Freq: Every day | ORAL | Status: DC
  Administered 2018-11-16 – 2018-11-17 (×2): 150 mg via ORAL
  Filled 2018-11-16 (×2): qty 1

## 2018-11-16 MED ORDER — MAGNESIUM OXIDE 400 (241.3 MG) MG PO TABS
400.0000 mg | ORAL_TABLET | Freq: Every day | ORAL | Status: DC
  Administered 2018-11-16: 400 mg via ORAL
  Filled 2018-11-16 (×2): qty 1

## 2018-11-16 NOTE — Progress Notes (Signed)
CSW met with MOB after receiving consult for 'limited family support, financial stressors, and hx of depression'.   MOB and FOB were both at bedside- both pleasant and appropriate during conversation. This is MOB's 3rd pregnancy and has a family friend currently watching her other two children.  MOB voiced having a difficult day yesterday and difficult morning due to infant needing some additional tests.   MOB and FOB both recently moved from [place] after FOB accepted a job with the [employer]. FOB voiced having a supportive work environment in the event he needs to take time off.  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 MOB voiced having no family in the area- and stated both her and FOB only have one parent alive and neither are able to make the drive from [place].   MOB is currently on Zoloft for her history of depression however CSW did not get a chance to discuss baby blues vs. PPD. CSW can follow up if needed/ if Edinburgh score if 10 or greater.   Erin Davenport, LCSW Clinical Social Worker  [facility]  [phone]  

## 2018-11-16 NOTE — Lactation Note (Signed)
This note was copied from a baby's chart. Lactation Consultation Note  Patient Name: Kelly Haney [id number] Date: 11/16/2018  Mom visibly upset and reports infant just transferred to NICU.Mom reports she is worried stress will affect her supply and she feels her breasts are starting to get fuller.  Mom recently had a heart monitor placed under left breast inner upper quadrant.  About 1 inch scar from it. Mom reports being concerned it may affect milk supply.  Mom reports that breast usually makes more than the other.  Mom has bilateral cracks on both nipples and reports pain  and painful nipples she reports pain.  Initiated using DEBP with mom.  Mom able to get close to 20 ml of colostrum. Urged her to use coconut oil to make pumping more comfortable.  Urged her to pump 8-12 times day.  Urged her to add massage and hand expression to pumping.  Mom reports the DEBP she has is old.  Had planned on mostly breastfeeding.  Discussed contacting insurance company regarding getting new DEBP breastpump. Reviewed pump cleaning and encouraged her to pump as much as she can at baby's bedside.  Urged her to use warmth with pumping. Urged mom to call lactation as needed.   Maternal Data    Feeding    LATCH Score                   Interventions    Lactation Tools Discussed/Used     Consult Status      Kelly Haney 11/16/2018, 3:35 PM

## 2018-11-16 NOTE — Progress Notes (Signed)
Subjective: POD# 2 Live born female  Birth Weight: 8 lb 5 oz (3770 g) APGAR: 8, 9  Newborn Delivery   Birth date/time:  11/14/2018 15:37:00 Delivery type:  C-Section, Low Transverse Trial of labor:  No C-section categorization:  Repeat    Baby name: Myles Bilious emesis, distended bowel, w/u for intestinal obstruction in progress Circumcision on hold  Delivering provider: Noland Fordyce    Feeding: breast  Pain control at delivery: Spinal   Reports feeling stressed w/ newborn findings but coping.   Patient reports tolerating PO.   Breast symptoms:colostrum Pain controlled with PO meds Denies HA/SOB/C/P/N/V/dizziness. Flatus present. She reports vaginal bleeding as normal, no further clots since yesterday's episode.  She is ambulating, urinating without difficulty.     Objective:   VS:    Vitals:   11/15/18 1814 11/15/18 1818 11/16/18 0139 11/16/18 0554  BP: 140/85 125/82 120/64 (!) 143/89  Pulse: 83  64 83  Resp: 20  18 18   Temp: 98.3 F (36.8 C)  98 F (36.7 C) 98.4 F (36.9 C)  TempSrc: Oral  Oral Oral  SpO2: 98%  97% 97%  Weight:      Height:          Intake/Output Summary (Last 24 hours) at 11/16/2018 0850 Last data filed at 11/15/2018 1518 Gross per 24 hour  Intake 480 ml  Output 2336 ml  Net -1856 ml        Recent Labs    11/13/18 1109 11/15/18 0557  WBC 10.6* 16.1*  HGB 12.4 9.0*  HCT 38.5 27.6*  PLT 239 197     Blood type: --/--/O POS, O POS Performed at [facility], [address], [place], [place] [address]  (01/21 1109)  Rubella: Immune (08/28 0000)  Vaccines: TDaP UTD         Flu    UTD   Physical Exam:  General: alert, cooperative and mild distress / crying earlier with newborn findings CV: Regular rate and rhythm Resp: clear Abdomen: soft, nontender, normal bowel sounds Incision: dry, intact and serous and small amount drainage present Uterine Fundus: firm, below umbilicus, nontender Lochia: minimal Ext: trace edema,  no redness or tenderness in the calves or thighs    Assessment/Plan: 33 y.o.   POD# 2. [id number]                  Principal Problem:   Postpartum care following cesarean delivery (1/22) Active Problems:   S/P cesarean section: Repeat   Previous cesarean section   Maternal anemia, with delivery  - started oral fe and mag ox  Doing well, stable.    Routine post-op care May DC home later today if infant transferred to [facility]  Neta Mends, CNM, MSN 11/16/2018, 8:50 AM

## 2018-11-17 MED ORDER — OXYCODONE HCL 5 MG PO TABS: 5.0000 mg | ORAL_TABLET | ORAL | 0 refills | Status: DC | PRN

## 2018-11-17 MED ORDER — MAGNESIUM OXIDE 400 (241.3 MG) MG PO TABS: 400.0000 mg | ORAL_TABLET | Freq: Every day | ORAL | 0 refills | Status: DC

## 2018-11-17 MED ORDER — SERTRALINE HCL 100 MG PO TABS: 100.0000 mg | ORAL_TABLET | Freq: Every day | ORAL | 0 refills | Status: DC

## 2018-11-17 MED ORDER — POLYSACCHARIDE IRON COMPLEX 150 MG PO CAPS: 150.0000 mg | ORAL_CAPSULE | Freq: Every day | ORAL | 0 refills | Status: DC

## 2018-11-17 MED ORDER — IBUPROFEN 800 MG PO TABS: 800.0000 mg | ORAL_TABLET | Freq: Three times a day (TID) | ORAL | 0 refills | Status: DC

## 2018-11-17 NOTE — Discharge Summary (Signed)
OB Discharge Summary  Patient Name: Kelly Haney DOB: 01-Mar-1986 MRN: [id number]  Date of admission: 11/14/2018  Admitting diagnosis: Previous Cesarean Section Intrauterine pregnancy: [redacted]w[redacted]d      Date of discharge: 11/17/2018    Discharge diagnosis: Term Pregnancy Delivered      Prenatal history: [id number]   EDC : 11/19/2018, by Other Basis  Prenatal care at [facility]  Primary provider : Ernestina Penna Prenatal course complicated by previous CS  Prenatal Labs: ABO, Rh: --/--/O POS, Val [place] POS Performed at [facility], [address], [place] , [place] [address]  [phone]/21 1109)  Antibody: NEG (01/21 1109) Rubella -Immune RPR: Non Reactive (01/21 1109)  HBsAg:    HIV: Non-reactive (08/28 0000)  GBS: Negative (01/03 0000)                                    Hospital course:  Sceduled C/S   33 y.o. yo [id number] at [redacted]w[redacted]d was admitted to the hospital 11/14/2018 for scheduled cesarean section with the following indication:Elective Repeat.  Membrane Rupture Time/Date: 3:36 PM ,11/14/2018   Patient delivered a Viable infant.11/14/2018  Details of operation can be found in separate operative note.  Pateint had an uncomplicated postpartum course.  She is ambulating, tolerating a regular diet, passing flatus, and urinating well. Patient is discharged home in stable condition on  11/17/18        Delivering PROVIDER: Noland Fordyce                                                            Complications: None  Newborn Data: Live born female  Birth Weight: 8 lb 5 oz (3770 g) APGAR: 8, 9  Newborn Delivery   Birth date/time:  11/14/2018 15:37:00 Delivery type:  C-Section, Low Transverse Trial of labor:  No C-section categorization:  Repeat     Baby Feeding: Bottle, Breast and formula Disposition: transfer to [facility]   Post partum procedures:none  Labs: Lab Results  Component Value Date   WBC 16.1 (H) 11/15/2018   HGB 9.0 (L) 11/15/2018   HCT 27.6 (L) 11/15/2018   MCV 94.2 11/15/2018   PLT 197 11/15/2018   No flowsheet data found.    Physical Exam @ time of discharge:  Vitals:   11/16/18 0554 11/16/18 1412 11/16/18 2330 11/17/18 0558  BP: (!) 143/89 (!) 149/90 121/87 132/87  Pulse: 83 (!) 104 79 65  Resp: 18 16 16 16   Temp: 98.4 F (36.9 C) 98.6 F (37 C) 97.8 F (36.6 C) 98.6 F (37 C)  TempSrc: Oral Oral Oral Oral  SpO2: 97%  99% 97%  Weight:      Height:        General: alert, cooperative and no distress Lochia: appropriate Uterine Fundus: firm Perineum: intact Incision: Healing well with no significant drainage Extremities: DVT Evaluation: No evidence of DVT seen on physical exam.   Discharge instructions:  "Baby and Me Booklet" and [facility] Booklet  Discharge Medications:  Allergies as of 11/17/2018      Reactions   Bee Venom Anaphylaxis   Shellfish Allergy Anaphylaxis   Penicillins Rash   DID THE REACTION INVOLVE: Swelling of the face/tongue/throat, SOB, or low BP? No  Sudden or severe rash/hives, skin peeling, or the inside of the mouth or nose? No Did it require medical treatment? No When did it last happen?More than 2 years If all above answers are "NO", may proceed with cephalosporin use.      Medication List    TAKE these medications   ibuprofen 800 MG tablet Commonly known as:  ADVIL,MOTRIN Take 1 tablet (800 mg total) by mouth every 8 (eight) hours.   iron polysaccharides 150 MG capsule Commonly known as:  NIFEREX Take 1 capsule (150 mg total) by mouth daily. Start taking on:  November 18, 2018   magnesium oxide 400 (241.3 Mg) MG tablet Commonly known as:  MAG-OX Take 1 tablet (400 mg total) by mouth daily.   oxyCODONE 5 MG immediate release tablet Commonly known as:  Oxy IR/ROXICODONE Take 1-2 tablets (5-10 mg total) by mouth every 4 (four) hours as needed for moderate pain.   pantoprazole 40 MG tablet Commonly known as:  PROTONIX Take 40 mg by mouth daily.   PRENATAL COMPLETE PO Take 1  tablet by mouth daily.   sertraline 100 MG tablet Commonly known as:  ZOLOFT Take 1 tablet (100 mg total) by mouth daily.            Discharge Care Instructions  (From admission, onward)         Start     Ordered   11/17/18 0000  Discharge wound care:    Comments:  Leave honeycomb in place for 5 days - remove if get wet in shower. Leave steri-strips in place x 2 weeks. Keep incision clean and dry   11/17/18 1002          Diet: routine diet  Activity: Advance as tolerated. Pelvic rest x 6 weeks.   Follow up:2 weeks    Signed: Marlinda Mikeanya Tahj Lindseth CNM, MSN, [facility] 11/17/2018, 10:03 AM

## 2018-11-17 NOTE — Progress Notes (Signed)
POSTOPERATIVE DAY # 3 S/P CS- repeat CS  S:         Reports feeling anxious and nervous - need to get to [facility] ASAP             Tolerating po intake / no nausea / no vomiting / + flatus / no BM             Bleeding is light             Pain controlled with motrin             Up ad lib / ambulatory/ voiding QS  Newborn transferred to [facility] this AM  O:  VS: BP 132/87 (BP Location: Right Arm)   Pulse 65   Temp 98.6 F (37 C) (Oral)   Resp 16   Ht 5\' 1"  (1.549 m)   Wt 72.6 kg   SpO2 97%   Breastfeeding Unknown   BMI 30.23 kg/m   LABS:              Recent Labs    11/15/18 0557  WBC 16.1*  HGB 9.0*  PLT 197               Bloodtype: --/--/O POS, O POS Performed at [facility], [address], [place], [place] [address]  (01/21 1109)  Rubella: Immune (08/28 0000)                                         Physical Exam:             Alert and Oriented X3  Abdomen: soft, non-tender, non-distended              Fundus: firm, non-tender, U-1             Dressing not intact - underside wet and saturated - needs changing prior to DC              Incision:  approximated with suture / no erythema / no ecchymosis / no drainage  Perineum: intact  Lochia: light  Extremities: 1+ edema, no calf pain or tenderness  A:        POD # 3 S/P repeat CS            Anxiety/depression history with acute health crisis with newborn                - discussed need for self-care as priority to be able to care for ill newborn and other children               - take zoloft daily               - need for rest and sleep for surgery recovery and keep anxiety in control             DC to [facility] - instructed not to drive x 2 weeks / need to rest every 4 hour & elevate feet  P:        Routine postoperative care              DC   Marlinda Mike CNM, MSN, [facility] 11/17/2018, 9:37 AM

## 2018-11-17 NOTE — Lactation Note (Signed)
This note was copied from a baby's chart. Lactation Consultation Note  Patient Name: Kelly Haney [id number] Date: 11/17/2018   Visited with P3 Mom of term baby at 30 hrs old.  Baby in NICU and being transferred to [facility].  Mom has been pumping with Symphony DEBP, now expressing 3 oz per breast.  Breast full with some palpable knots.  Encouraged Mom to try warm compresses and massage prior to pumping.  Recommend double pumping every 2-3 hrs to support a full milk supply.    Mom has 2 pumps at home (Medela and Lansinoh).  Recommended using hospital grade pump at [facility] if one is available, due to stronger motor.  Mom aware of OP lactation support available to her.  Mom encouraged to call prn.    [name] 11/17/2018, 8:54 AM

## 2018-12-28 DIAGNOSIS — Z95818 Presence of other cardiac implants and grafts: Secondary | ICD-10-CM | POA: Diagnosis not present

## 2018-12-28 DIAGNOSIS — Z4509 Encounter for adjustment and management of other cardiac device: Secondary | ICD-10-CM | POA: Diagnosis not present

## 2019-01-04 ENCOUNTER — Telehealth: Payer: Self-pay | Admitting: Cardiology

## 2019-01-04 NOTE — Telephone Encounter (Signed)
Patient noted to have brief episode of PSVT on loop recorder. She denies any symptoms. Will continue to monitor. No symptoms.

## 2019-03-02 ENCOUNTER — Telehealth: Payer: Self-pay | Admitting: Cardiology

## 2019-03-02 NOTE — Telephone Encounter (Signed)
Remote loop recorder check 5.5.20: There was 1 symptomatic with dyspnea and dizziness patient activation recorded. ECG suggests SVT @ 130/min very brief. I discussed with the patient the event and told her it was very brief. No syncope. Continue observation.

## 2019-03-27 DIAGNOSIS — Z4509 Encounter for adjustment and management of other cardiac device: Secondary | ICD-10-CM

## 2019-03-27 DIAGNOSIS — Z95818 Presence of other cardiac implants and grafts: Secondary | ICD-10-CM | POA: Diagnosis not present

## 2019-03-27 DIAGNOSIS — I471 Supraventricular tachycardia: Secondary | ICD-10-CM

## 2019-03-28 ENCOUNTER — Telehealth: Payer: Self-pay | Admitting: Cardiology
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
## 2019-04-27 DIAGNOSIS — I471 Supraventricular tachycardia: Secondary | ICD-10-CM | POA: Diagnosis not present

## 2019-04-27 DIAGNOSIS — Z4509 Encounter for adjustment and management of other cardiac device: Secondary | ICD-10-CM | POA: Diagnosis not present

## 2019-04-27 DIAGNOSIS — Z95818 Presence of other cardiac implants and grafts: Secondary | ICD-10-CM | POA: Diagnosis not present

## 2019-05-01 ENCOUNTER — Encounter: Payer: Self-pay | Admitting: Cardiology

## 2019-05-01 DIAGNOSIS — Z4509 Encounter for adjustment and management of other cardiac device: Secondary | ICD-10-CM | POA: Insufficient documentation

## 2019-05-01 HISTORY — DX: Encounter for adjustment and management of other cardiac device: Z45.09

## 2019-05-27 DIAGNOSIS — Z4509 Encounter for adjustment and management of other cardiac device: Secondary | ICD-10-CM | POA: Diagnosis not present

## 2019-05-27 DIAGNOSIS — I471 Supraventricular tachycardia: Secondary | ICD-10-CM | POA: Diagnosis not present

## 2019-05-27 DIAGNOSIS — Z95818 Presence of other cardiac implants and grafts: Secondary | ICD-10-CM | POA: Diagnosis not present

## 2019-06-03 ENCOUNTER — Telehealth: Payer: Self-pay

## 2019-06-03 NOTE — Telephone Encounter (Signed)
-----   Message from Adrian Prows, MD sent at 05/29/2019  9:31 PM EDT ----- Regarding: Loop recorder Loop recorder reveals normal rhythm. She has one patient activated event and showed PAC. No SVT or A. FIb.  PAC = Premature atrial complexes: These arise from the upper chamber of the heart.  These are very common and are not dangerous.  Extra skipped beat coming from the upper chamber (atrium) and mostly are life altering (nuisance) than life threatening and mostly treated by reassurance.  There may not be any specific reasons for this, however patients with excessive caffeine, anxiety, lack of sleep, alcohol or thyroid problems can have these episodes.   JG

## 2019-06-03 NOTE — Telephone Encounter (Signed)
Pt aware of transmission and will call if she needs [place]

## 2019-06-27 DIAGNOSIS — Z95818 Presence of other cardiac implants and grafts: Secondary | ICD-10-CM | POA: Diagnosis not present

## 2019-06-27 DIAGNOSIS — Z4509 Encounter for adjustment and management of other cardiac device: Secondary | ICD-10-CM | POA: Diagnosis not present

## 2019-06-27 DIAGNOSIS — I471 Supraventricular tachycardia: Secondary | ICD-10-CM | POA: Diagnosis not present

## 2019-06-28 ENCOUNTER — Encounter: Payer: Self-pay | Admitting: Cardiology

## 2019-06-28 DIAGNOSIS — I471 Supraventricular tachycardia: Secondary | ICD-10-CM | POA: Insufficient documentation

## 2019-06-28 DIAGNOSIS — R002 Palpitations: Secondary | ICD-10-CM | POA: Insufficient documentation

## 2019-07-02 ENCOUNTER — Telehealth: Payer: Self-pay

## 2019-07-02 NOTE — Telephone Encounter (Signed)
LMOM with details

## 2019-07-02 NOTE — Telephone Encounter (Signed)
-----   Message from Adrian Prows, MD sent at 06/28/2019  3:53 PM EDT ----- Regarding: Loop recorder Scheduled Remote loop recorder check  9.2.20:  No significant arrhythmias noted. Has h/o brief SVT. No A. Fib. Continue remote monitoring.

## 2019-07-28 DIAGNOSIS — Z95818 Presence of other cardiac implants and grafts: Secondary | ICD-10-CM | POA: Diagnosis not present

## 2019-07-28 DIAGNOSIS — I471 Supraventricular tachycardia: Secondary | ICD-10-CM | POA: Diagnosis not present

## 2019-07-28 DIAGNOSIS — Z4509 Encounter for adjustment and management of other cardiac device: Secondary | ICD-10-CM | POA: Diagnosis not present

## 2019-07-30 ENCOUNTER — Encounter: Payer: Self-pay | Admitting: Cardiology

## 2019-09-01 ENCOUNTER — Telehealth: Payer: Self-pay | Admitting: Cardiology

## 2019-09-01 NOTE — Telephone Encounter (Signed)
Scheduled Remote loop recorder check  11.01.20:  No further brief SVT episodes. NSR. No symptoms. Uneventful monitoring. Left message to patient

## 2019-09-16 ENCOUNTER — Encounter: Payer: Self-pay | Admitting: Cardiology

## 2019-09-16 ENCOUNTER — Ambulatory Visit (INDEPENDENT_AMBULATORY_CARE_PROVIDER_SITE_OTHER): Payer: 59 | Admitting: Cardiology

## 2019-09-16 ENCOUNTER — Other Ambulatory Visit: Payer: Self-pay

## 2019-09-16 VITALS — BP 116/78 | HR 70 | Temp 98.4°F | Ht 61.0 in | Wt 146.8 lb

## 2019-09-16 DIAGNOSIS — Z95818 Presence of other cardiac implants and grafts: Secondary | ICD-10-CM

## 2019-09-16 DIAGNOSIS — I471 Supraventricular tachycardia: Secondary | ICD-10-CM

## 2019-09-16 DIAGNOSIS — Z8249 Family history of ischemic heart disease and other diseases of the circulatory system: Secondary | ICD-10-CM | POA: Diagnosis not present

## 2019-09-16 NOTE — Progress Notes (Signed)
Primary Physician/Referring:  Patient, No Pcp Per  Patient ID: Kelly BradfordSusan Beman, female    DOB: 02-04-1986, 33 y.o.   MRN: [id number]  Chief Complaint  Patient presents with  . Palpitations    follow up   HPI:    HPI: Kelly Haney  is a 33 y.o. with chronic palpitations that have been ongoing since teenage years, Has loop recorder implanted for frequent palpitations on 03/14/2018 and was found to have brief episodes of PSVT, Mitral valve prolapse and mild to moderate mitral regurgitation by echocardiogram in 2019, strong family history of premature coronary artery disease and mother had myocardial infarction at age 33 (SCAD) and then developed CAD and CABG at age 33 or 3145 years, died at age 33 (accidental).  She is here on annual follow up visit. She is postpartum 10 months.  Has not had any significant palpitations.  Presently doing well.  Past Medical History:  Diagnosis Date  . Anxiety   . Blood transfusion without reported diagnosis   . Depression    zoloft  . Dysrhythmia    SVT has loop recorder in place  . Encounter for loop recorder check 05/01/2019  . GERD (gastroesophageal reflux disease)   . Hearing loss    right ear  . Hemorrhoids during pregnancy   . History of endometriosis   . Loop in situ: Medtronic Linq for SVT and syncope 03/14/2018   Scheduled Remote loop recorder check  9.2.20:  No significant arrhythmias noted. Has h/o brief SVT. No A. Fib. Continue remote monitoring.   Past Surgical History:  Procedure Laterality Date  . CESAREAN SECTION    . CESAREAN SECTION N/A 11/14/2018   Procedure: Repeat CESAREAN SECTION;  Surgeon: Noland FordyceFogleman, Kelly, MD;  [facility];  Service: Obstetrics;  Laterality: N/A;  EDD: 11/19/18 Allergy: Shellfish, Penicillin, Amoxicillin   Social History   Socioeconomic History  . Marital status: Married    Spouse name: Not on file  . Number of children: Not on file  . Years of education: Not on file  . Highest education  level: Not on file  Occupational History  . Not on file  Social Needs  . Financial resource strain: Not hard at all  . Food insecurity    Worry: Never true    Inability: Never true  . Transportation needs    Medical: No    Non-medical: Not on file  Tobacco Use  . Smoking status: Never Smoker  . Smokeless tobacco: Never Used  Substance and Sexual Activity  . Alcohol use: Not Currently  . Drug use: Never  . Sexual activity: Not on file  Lifestyle  . Physical activity    Days per week: Not on file    Minutes per session: Not on file  . Stress: To some extent  Relationships  . Social Musicianconnections    Talks on phone: Not on file    Gets together: Not on file    Attends religious service: Not on file    Active member of club or organization: Not on file    Attends meetings of clubs or organizations: Not on file    Relationship status: Not on file  . Intimate partner violence    Fear of current or ex partner: No    Emotionally abused: No    Physically abused: No    Forced sexual activity: No  Other Topics Concern  . Not on file  Social History Narrative  . Not on file   ROS  Review of Systems  Constitution: Negative for decreased appetite, malaise/fatigue, weight gain and weight loss.  Eyes: Negative for visual disturbance.  Cardiovascular: Positive for palpitations. Negative for chest pain, claudication, dyspnea on exertion, leg swelling, orthopnea and syncope.  Respiratory: Negative for hemoptysis and wheezing.   Endocrine: Negative for cold intolerance and heat intolerance.  Hematologic/Lymphatic: Does not bruise/bleed easily.  Skin: Negative for nail changes.  Musculoskeletal: Negative for muscle weakness and myalgias.  Gastrointestinal: Negative for abdominal pain, change in bowel habit, nausea and vomiting.  Neurological: Negative for difficulty with concentration, dizziness, focal weakness and headaches.  Psychiatric/Behavioral: Negative for altered mental status  and suicidal ideas.  All other systems reviewed and are negative.  Objective  Blood pressure 116/78, pulse 70, temperature 98.4 F (36.9 C), height 5\' 1"  (1.549 m), weight 146 lb 12.8 oz (66.6 kg), SpO2 97 %, unknown if currently breastfeeding. Body mass index is 27.74 kg/m.   Physical Exam  Constitutional: She is oriented to person, place, and time. Vital signs are normal. She appears well-developed and well-nourished.  HENT:  Head: Normocephalic and atraumatic.  Neck: Normal range of motion.  Cardiovascular: Normal rate, regular rhythm and intact distal pulses. Exam reveals a midsystolic click.  Murmur heard.  Midsystolic murmur is present with a grade of 3/6 at the upper right sternal border.  Mid to late systolic murmur of grade 2/6 is also present at the apex. Pulses:      Carotid pulses are on the right side with bruit and on the left side with bruit. Pulmonary/Chest: Effort normal and breath sounds normal. No accessory muscle usage. No respiratory distress.  Abdominal: Soft. Bowel sounds are normal.  Musculoskeletal: Normal range of motion.  Neurological: She is alert and oriented to person, place, and time.  Skin: Skin is warm and dry.  Vitals reviewed.  Radiology: No results found.  Laboratory examination:   No results for input(s): NA, K, CL, CO2, GLUCOSE, BUN, CREATININE, CALCIUM, GFRNONAA, GFRAA in the last 8760 hours. No flowsheet data found. CBC Latest Ref Rng & Units 11/15/2018 11/13/2018  WBC 4.0 - 10.5 K/uL 16.1(H) 10.6(H)  Hemoglobin 12.0 - 15.0 g/dL 9.0(L) 12.4  Hematocrit 36.0 - 46.0 % 27.6(L) 38.5  Platelets 150 - 400 K/uL 197 239   Lipid Panel  No results found for: CHOL, TRIG, HDL, CHOLHDL, VLDL, LDLCALC, LDLDIRECT HEMOGLOBIN A1C No results found for: HGBA1C, MPG TSH No results for input(s): TSH in the last 8760 hours. Medications   Current Outpatient Medications  Medication Instructions  . Prenatal Vit-Fe Fumarate-FA (PRENATAL COMPLETE PO) 1  tablet, Oral, Daily    Cardiac Studies:   Echocardiogram 08/23/2018: Left ventricle cavity is normal in size. Normal global wall motion. Normal diastolic filling pattern. Calculated EF 55%. Mild prolapse of the mitral valve leaflets. Mild to moderate mitral regurgitation. Trace tricuspid regurgitation. Inadequate tricuspid regurgitation jet to estimate pulmonary artery pressure  Assessment     ICD-10-CM   1. PSVT (paroxysmal supraventricular tachycardia) (HCC)  I47.1   2. Status post placement of implantable loop recorder  Z95.818   3. Family history of premature CAD  Z82.49 Lipid Panel With LDL/HDL Ratio   Mother who had myocardial infarctions at age 59, SCAD and CAD 83 Y had CABG    Scheduled Remote loop recorder check  11.01.20:  No further brief SVT episodes. NSR. No symptoms. Uneventful monitoring.  Scheduled Inperson loop recorder check 08/01/2018: Predominant rhythm is normal sinus rhythm, brief episodes of PSVT with rapid onset and offset, longest  2 minutes. Reprogrammed detection rate from 200 bpm to 160 bpm. Continue remote monitoring. Scheduled Remote loop recorder transmission 08/28/2018: One symptomatic. Patient activated recordings reveals 5 PVCs. No other significant arrhythmias.  Recommendations:   Patient presents here for follow-up and evaluation of PSVT, this is an annual visit.  Fortunately she has not had any significant episodes, she is an occasional palpitations.  I reviewed the loop recorder implantation data with her, will continue to monitor.  With regard to family history of premature coronary artery disease, she is postpartum now that his 10 months, will check lipid panel.  I will see her back on an annual basis.  Adrian Prows, MD, [facility] 09/16/2019, 2:49 PM [facility] [place] Pager: [phone] Office: [phone] If no answer Cell [phone]

## 2019-09-28 DIAGNOSIS — Z4509 Encounter for adjustment and management of other cardiac device: Secondary | ICD-10-CM | POA: Diagnosis not present

## 2019-09-28 DIAGNOSIS — Z95818 Presence of other cardiac implants and grafts: Secondary | ICD-10-CM | POA: Diagnosis not present

## 2019-09-28 DIAGNOSIS — I471 Supraventricular tachycardia: Secondary | ICD-10-CM | POA: Diagnosis not present

## 2019-10-29 DIAGNOSIS — Z95818 Presence of other cardiac implants and grafts: Secondary | ICD-10-CM | POA: Diagnosis not present

## 2019-10-29 DIAGNOSIS — Z4509 Encounter for adjustment and management of other cardiac device: Secondary | ICD-10-CM | POA: Diagnosis not present

## 2019-10-29 DIAGNOSIS — I471 Supraventricular tachycardia: Secondary | ICD-10-CM | POA: Diagnosis not present

## 2020-01-28 ENCOUNTER — Encounter (HOSPITAL_COMMUNITY): Payer: Self-pay

## 2020-01-28 ENCOUNTER — Other Ambulatory Visit (HOSPITAL_COMMUNITY): Payer: Self-pay | Admitting: Obstetrics & Gynecology

## 2020-01-28 DIAGNOSIS — Z3A20 20 weeks gestation of pregnancy: Secondary | ICD-10-CM

## 2020-01-28 DIAGNOSIS — Z3689 Encounter for other specified antenatal screening: Secondary | ICD-10-CM

## 2020-01-30 ENCOUNTER — Ambulatory Visit (HOSPITAL_COMMUNITY)
Admission: RE | Admit: 2020-01-30 | Discharge: 2020-01-30 | Disposition: A | Discharge status: Home / Self Care | Payer: 59 | Source: Ambulatory Visit | Attending: Obstetrics and Gynecology | Admitting: Obstetrics and Gynecology

## 2020-01-30 ENCOUNTER — Ambulatory Visit (HOSPITAL_COMMUNITY): Payer: 59 | Admitting: *Deleted

## 2020-01-30 ENCOUNTER — Encounter (HOSPITAL_COMMUNITY): Payer: Self-pay

## 2020-01-30 ENCOUNTER — Other Ambulatory Visit: Payer: Self-pay

## 2020-01-30 ENCOUNTER — Other Ambulatory Visit (HOSPITAL_COMMUNITY): Payer: Self-pay | Admitting: *Deleted

## 2020-01-30 VITALS — BP 123/79 | HR 89 | Temp 97.5°F

## 2020-01-30 DIAGNOSIS — O352XX0 Maternal care for (suspected) hereditary disease in fetus, not applicable or unspecified: Secondary | ICD-10-CM

## 2020-01-30 DIAGNOSIS — Z3A20 20 weeks gestation of pregnancy: Secondary | ICD-10-CM

## 2020-01-30 DIAGNOSIS — O2690 Pregnancy related conditions, unspecified, unspecified trimester: Secondary | ICD-10-CM | POA: Diagnosis not present

## 2020-01-30 DIAGNOSIS — O99012 Anemia complicating pregnancy, second trimester: Secondary | ICD-10-CM | POA: Diagnosis not present

## 2020-01-30 DIAGNOSIS — Z362 Encounter for other antenatal screening follow-up: Secondary | ICD-10-CM

## 2020-01-30 DIAGNOSIS — Z3689 Encounter for other specified antenatal screening: Secondary | ICD-10-CM | POA: Insufficient documentation

## 2020-01-30 DIAGNOSIS — D649 Anemia, unspecified: Secondary | ICD-10-CM | POA: Diagnosis not present

## 2020-01-30 DIAGNOSIS — O0990 Supervision of high risk pregnancy, unspecified, unspecified trimester: Secondary | ICD-10-CM

## 2020-01-30 DIAGNOSIS — Z363 Encounter for antenatal screening for malformations: Secondary | ICD-10-CM

## 2020-01-30 DIAGNOSIS — O34219 Maternal care for unspecified type scar from previous cesarean delivery: Secondary | ICD-10-CM

## 2020-02-27 ENCOUNTER — Ambulatory Visit (HOSPITAL_COMMUNITY): Payer: 59

## 2020-03-02 ENCOUNTER — Encounter: Payer: Self-pay | Admitting: *Deleted

## 2020-03-02 ENCOUNTER — Ambulatory Visit: Payer: 59 | Admitting: *Deleted

## 2020-03-02 ENCOUNTER — Ambulatory Visit (HOSPITAL_BASED_OUTPATIENT_CLINIC_OR_DEPARTMENT_OTHER): Payer: 59 | Admitting: Genetic Counselor

## 2020-03-02 ENCOUNTER — Encounter (HOSPITAL_COMMUNITY): Payer: 59

## 2020-03-02 ENCOUNTER — Other Ambulatory Visit: Payer: Self-pay | Admitting: *Deleted

## 2020-03-02 ENCOUNTER — Ambulatory Visit (HOSPITAL_COMMUNITY): Payer: 59 | Attending: Obstetrics and Gynecology

## 2020-03-02 ENCOUNTER — Ambulatory Visit: Payer: Self-pay | Admitting: Genetic Counselor

## 2020-03-02 ENCOUNTER — Other Ambulatory Visit: Payer: Self-pay

## 2020-03-02 VITALS — BP 142/77 | HR 79 | Temp 97.2°F

## 2020-03-02 DIAGNOSIS — Z8379 Family history of other diseases of the digestive system: Secondary | ICD-10-CM

## 2020-03-02 DIAGNOSIS — Z315 Encounter for genetic counseling: Secondary | ICD-10-CM | POA: Diagnosis not present

## 2020-03-02 DIAGNOSIS — O2692 Pregnancy related conditions, unspecified, second trimester: Secondary | ICD-10-CM

## 2020-03-02 DIAGNOSIS — Z362 Encounter for other antenatal screening follow-up: Secondary | ICD-10-CM

## 2020-03-02 DIAGNOSIS — Z8279 Family history of other congenital malformations, deformations and chromosomal abnormalities: Secondary | ICD-10-CM | POA: Diagnosis not present

## 2020-03-02 DIAGNOSIS — O34219 Maternal care for unspecified type scar from previous cesarean delivery: Secondary | ICD-10-CM | POA: Diagnosis not present

## 2020-03-02 DIAGNOSIS — O352XX0 Maternal care for (suspected) hereditary disease in fetus, not applicable or unspecified: Secondary | ICD-10-CM

## 2020-03-02 DIAGNOSIS — D649 Anemia, unspecified: Secondary | ICD-10-CM | POA: Diagnosis not present

## 2020-03-02 DIAGNOSIS — Z3A24 24 weeks gestation of pregnancy: Secondary | ICD-10-CM | POA: Diagnosis not present

## 2020-03-02 DIAGNOSIS — O99012 Anemia complicating pregnancy, second trimester: Secondary | ICD-10-CM | POA: Diagnosis not present

## 2020-03-02 DIAGNOSIS — O09299 Supervision of pregnancy with other poor reproductive or obstetric history, unspecified trimester: Secondary | ICD-10-CM | POA: Insufficient documentation

## 2020-03-02 NOTE — Progress Notes (Signed)
03/02/2020  Kelly Haney 12-24-1985 MRN: [id number] DOV: 03/02/2020  Kelly Haney presented to the [facility] for a genetics consultation regarding her history of a previous child with Hirschsprung disease. Kelly Haney came to her appointment alone due to COVID-19 visitor restrictions.   Indication for genetic counseling - Previous child with Hirschsprung disease  Prenatal history  Kelly Haney is a [id number], 34 y.o. female. Her current pregnancy has completed [redacted]w[redacted]d(Estimated Date of Delivery: 06/16/20). Ms. ESlapehad one spontaneous miscarriage at 182 weeks gestation with a prior partner. She has had two spontaneous miscarriages with her husband, in addition to a 556year old daughter, a 332year old daughter, and a 150month old son.  Ms. EWattersdenied exposure to environmental toxins or chemical agents. She denied the use of alcohol, tobacco or street drugs. She reported taking Sertraline, Zofran, prenatal vitamins, and Tums. She denied significant viral illnesses, fevers, and bleeding during the course of her pregnancy. [name] a history of supraventricular tachycardia, mitral valve prolapse, and a heart murmur. She has a loop recorder in place. She has had a Cesarean section with all three of her children. Her medical and surgical histories were otherwise noncontributory.  Family History  A three generation pedigree was drafted and reviewed. The family history is remarkable for the following:  - Kelly Haney's son has a history of Hirschsprung disease, ear pits, and sensory-seeking behaviors. See Discussion section for more details.  - Ms. ENormentand her brother both have supraventricular tachycardia, mitral valve prolapse, and heart murmurs. Their mother had a history of mitral valve prolapse and had heart attacks at age 7563and 374 She ultimately passed away at age 34 We discussed that many forms of heart disease are multifactorial in nature, occurring due to a  combination of genetic, lifestyle, and environmental factors. Some families appear to have a strong family history of heart disease, and certain genetic conditions can be associated with heart attacks at young ages. Given Kelly Haney's personal and family histories of heart disease, the chance of recurrence for her children could be as high as 50%.  - Ms. EPoliquinhas had three miscarriages and her mother had a history of four miscarriages. There are many potential causes of recurrent miscarriages, including anatomic, immunologic, endocrine, and genetic factors. However, a cause is only identified in 50% of individuals who experience recurrent miscarriages. Abnormalities of chromosome number or structure are the most common cause of sporadic early pregnancy loss. A significant proportion of recurrent miscarriages may also be associated with structural or numerical chromosome abnormalities. Approximately 2-5% of couples who experience multiple miscarriages carry a balanced translocation that can become unbalanced and potentially lethal in their offspring. In addition to chromosomal abnormalities, certain single gene, X-linked, and polygenic/multifactorial disorders are associated with recurrent miscarriage. If [name] interested, we could pursue testing to determine if there is a genetic basis for her history of recurrent pregnancy loss.  - [name] a family history of cancer. Her maternal uncle died at age 3147due to throat cancer. He had a history of smoking and tobacco use. Ms Viti's maternal grandmother had melanoma. [name] had esophageal cancer at age 34 He is a smoker. [name] uncle died at age 6787of liver cancer. [name] grandfather died of esophageal cancer. Though most cancers are thought to be sporadic or due to environmental factors, some families appear to have a strong predisposition to cancers.  When considering a family history of cancer, we look  for common types of cancer in multiple family members occurring at younger than typical ages. We discussed the option of meeting with a cancer genetic counselor to discuss any possible screening or testing options available. If [name] is concerned about the family history of cancer and would like to learn more about the family's chance for an inherited cancer syndrome, she or her healthcare provider may refer her to the [facility] [phone]).   - [name]'s father and two paternal uncles have a history of Barrett's esophagus. Barrett's esophagus is associated with an increased risk for esophageal cancer, which [name] family has a history of. Barrett's esophagus usually occurs sporadically in people with no family history of the condition. In rare cases, it can affect more than one family member; however, it is unclear whether these cases are due to common environmental exposures or an inherited predisposition (or a combination of the two). Given her family history of the condition, [name] may be at increased risk of developing Barrett's esophagus herself.  The remaining family histories were reviewed and found to be noncontributory for birth defects, intellectual disability, recurrent pregnancy loss, and known genetic conditions. [name] had limited information about her partner's family history; thus, risk assessment was limited.  The patient's [place]. The father of the pregnancy's ethnicity is [place]. Ashkenazi Jewish ancestry and consanguinity were denied. Pedigree will be scanned under Media.  Discussion  [name] was referred for genetic counseling due to her history of having a previous child with Hirschsprung disease.  Hirschsprung disease:  Hirschsprung disease is a condition that affects ~1 in 5000 individuals. Individuals with Hirschsprung disease do not have enough enteric nerve cells in the large intestine required to pass stool from  the body normally. Without these nerve cells, there is no trigger for muscle contractions to move stool through the intestines. As a result, individuals with Hirschspring disease experiences severe constipation or bowel obstruction. Common symptoms include vomiting, abdominal pain/swelling, diarrhea, poor feeding, malnutrition, and slow growth. Individuals with Hirschsprung disease also have an increased chance of developing enterocolitis and intestinal perforations. Symptoms generally begin within 48 hours of life with failure to pass meconium. Treatment for Hirschsprung disease is surgery to remove the aganglionic intestinal segment and join the bowel to the anus.  There are several forms of Hirschsprung disease defined by the region of the intestine that is lacking enteric nerve cells. Approximately 80% of individuals with Hirschsprung disease have short-segment disease. This is the type of Hirschsprung disease that Ms. Theisen's son has.   Hirschsprung disease may be isolated, associated with other birth defects but not part of a known syndrome, or syndromic. Syndromic cases make up 12% of Hirschsprung disease. Genetic syndromes associated with Hirschsprung disease include trisomy 58 (Down syndrome), Waardenburg syndrome type IV, and Mowat-Wilson syndrome among others. Syndromes may be de novo, occurring for the first time in an individual, or inherited. It is unclear if Kelly Haney's son has a syndrome that explains his symptoms, or if his Hirschsprung disease is isolated.  Approximately 50% of isolated cases of Hirschsprung disease are associated with mutations in several genes. The RET gene is the most common known genetic cause for isolated Hirschsprung disease. The RET gene provides instructions for producing a protein involved in cell signaling. It is essential for the normal development of enteric nerve cells. A pathogenic variant in the RET gene is found in 10-35% of  isolated short segment cases. The  RET gene is also associated with multiple endocrine neoplasia type 2 (MEN2). Individuals with MEN2 have an increased risk for medullary thyroid cancer, pheochromocytomas, and paragangliomas. Some individuals with pathogenic variants in the RET gene have both Hirschsprung disease and MEN2. Other genes associated with isolated Hirschsprung disease include EDNRB, EDN3, NRTN, ECE1, and GDNF. A pathogenic variant in one of these genes is sufficient to cause Hirschsprung disease in some individuals, while pathogenic variants in multiple genes may be causative in other cases.   The inheritance of Hirschsprung disease depends on the underlying etiology. Isolated cases may have multifactorial, multigenic, or autosomal dominant inheritance with incomplete penetrance (meaning individuals with a pathogenic variant in a Hirschsprung disease-related gene may not demonstrate signs of the condition). Autosomal recessive, autosomal dominant, and X-linked inheritance patterns have all been described in syndromic cases. Recurrence risk also depends on underlying etiology. For siblings of individuals with isolated Hirschsprung disease, recurrence risk is estimated to be 4-20%. Approximately 20% of Hirschsprung disease cases occur in multiple members of the same family.  Testing options:  Kelly Haney was interested in learning about testing options to further refine recurrence risks for the current fetus. We discussed that a referral to pediatric genetics would be warranted for her son. A geneticist would be able to perform a comprehensive physical examination on her son to determine if a specific syndrome is suspected. This would help in deciding which, if any, genetic testing options to pursue. Kelly Haney was not interested in a referral to pediatric genetics for her son at this time. She informed me that her son has been through many procedures in his short life and now has a fear of doctors and needles. For that reason, she  would prefer not to have him evaluated at this time. However, she still wanted as much information as possible about the possibility of her next child having Hirschsprung disease. She informed me that if she or her husband were identified to carry a genetic change associated with Hirschsprung disease, she would change her delivery plans. She is currently planning on delivering at [facility], but would prefer delivering at [facility] if the current fetus were at increased risk for Hirschsprung disease, as her son's surgeons are all there. For this reason, we discussed other available testing options for herself and her husband.  Ms. Vastine was counseled that there are panels available that analyze multiple genes associated with Hirschsprung disease. While it would be most informative to have her son tested first and to follow up with targeted variant analysis for Ms. Posch and her husband if he were found to have a specific variant, the couple may also be tested independently. The laboratory [facility] offers a multigene panel containing six genes that are associated with nonsyndromic Hirschsprung disease. The turnaround time for results from this panel is ~3 weeks. The laboratory [facility] also offers an option to build a custom panel containing as many genes associated with Hirschsprung disease as one desires; however, turnaround time for results from this option is ~8 weeks.  Ms. Silvey was counseled on the types of results that would be possible if she and her husband were to undergo genetic testing. The first type of result is positive. This means that Ms. Hannibal and/or her husband carry a pathogenic variant in a gene known to be associated with Hirschsprung disease. We reviewed that if Ms. Wendell or her husband received a positive result, that does not guarantee that the current fetus  would be affected by Hirschsprung disease, even if the fetus were to inherit the same change. This is due to the  possibility of incomplete penetrance. Additionally, identifying a gene change associated with Hirschsprung disease does not necessarily provide information about the anticipated severity of the condition.   The second type of result is negative. This means that Ms. Hickam and her husband do not carry a pathogenic variant in any of the genes that were tested. A negative result does not exclude the possibility of Hirschsprung disease in the current fetus. This is because it is possible that the couple's son could carry a variant in a part of the gene that was not sequenced or in a different gene altogether. It is also possible that their son's Hirschsprung disease does not have a genetic cause. Even if the Hirschsprung disease in the family is not associated with a known genetic cause, the current fetus still would have an increased chance of having Hirschsprung disease based on the positive family history alone.   The third and final result type is a variant of uncertain significance (VUS). A VUS means that a change was identified in one of the genes that was tested; however, it is unclear if this change is benign (not associated with Hirschsprung disease) or pathogenic (is known to be associated with Hirschsprung disease). The uncertainty surrounding a VUS can be anxiety-provoking for some individuals. When a greater number of genes are sequenced, the likelihood of identifying a VUS increases. Additionally, it is not recommend that prenatal diagnostic testing be performed to identify a VUS in a fetus given the uncertainty surrounding its clinical implications. Ms. Coval was familiar with amniocentesis and informed me that she would not be interested in pursuing this even if she or her husband were identified to carry a pathogenic variant associated with Hirschsprung disease.  Ms. Sohail was also counseled about the Genetic Information and Nondiscrimination Act (GINA). GINA prohibits discrimination on the  basis of genetic information with respect to health insurance and employment. However, the protections afforded by GINA do not apply to individuals employed by the [place] military and federal government. GINA also does not protect against discrimination for life or long-term care insurance. Ms. Knoche husband is a Education officer, museum. Given that he is employed by the city, it is possible that GINA may not protect him. This could have important implications if a variant in the RET gene were identified in him that is known to be associated with increased risks for cancer. Ms. Verner understood these limitations of GINA.  Finally, we discussed that Ms. [employer] company may not cover the cost of genetic testing for herself and her husband since a genetic variant has not yet been identified in her son. The multigene panel offered by [facility] has a self-pay option of $890 per person. The self-pay price for [facility]'s custom panel is $2000.   Research:  Ms. Domino was also informed about a research study on Hirschsprung disease offered by the [facility] at [facility]. This study is investigating the natural history of Hirschsprung disease and genetic causes of the condition. If the family is interested in taking part in the study, the laboratory can mail them a medical and family history questionnaire, informed consent forms, and collection tubes for blood samples from Ms. Horkey, her husband, and their son. The research study is free to participate in.  Ms. Fitzner inquired about the possible turnaround time for genetic testing results if they were  to enroll in this study. I informed her that since this is a research study, the laboratory may not be able to provide a precise turnaround time, and results may not be available until after the current fetus has already been delivered. Additionally, if a genetic variant were identified in their family, the findings  would need to be confirmed by a CLIA-certified laboratory before being used for any clinical decisions.   Ms. Long was potentially interested in participating in this research study if they would accept a sample type other than blood from her son.  Plan:  Ms. Gittens understood the limitations associated with clinical genetic testing for herself and her husband and is still interested in looking further into these options. I provided her with the CPT billing codes associated with the multigene panel offered by [facility] and encouraged her to contact her insurance company to see if they would cover the cost of this testing. I can write a Letter of Medical Necessity should their insurance company request it. I can also perform a benefits investigation for the custom panel option offered by [facility].   Ms. Min also requested that I reach out to the Hirschsprung disease research study coordinator Rebeca Alert to determine if they will accept a saliva or buccal sample from her son. She also requested that I ask if there is an estimated turnaround time for results from the study.  Ms. Calamia will contact me once she has heard from her insurance company about coverage for [facility] multigene panel. I will contact her once I have heard back from the research laboratory and have completed [facility]'s benefits investigation. All of Ms. Deharo's questions were answered to her satisfaction today.  I counseled Ms. Wernli regarding the above risks and available options. The approximate face-to-face time with the genetic counselor was 60 minutes.  In summary:  Reviewed family history concerns  Son with Hirschsprung disease  Discussed options for follow-up testing to determine if there is genetic cause for son's Hirschsprung disease, including benefits & limitations of testing  Recommended referral to pediatric genetics for patient's son, as testing him will be most informative. Patient declined  referral to pediatric genetics at this time  Patient interested in testing herself and her husband for genes associated with Hirschsprung disease to attempt to refine the risk for Hirschsprung disease in the current fetus. I will perform a benefits investigation for testing offered through [facility], and patient will contact insurance company to assess coverage for panel offered through [facility]  Discussed research study on the genetics of Hirschsprung disease at [facility]. Patient potentially interested in participating if buccal/saliva sample possible for son  Offered additional testing and screening  Declined amniocentesis   Buelah Manis, MS, [facility] Genetic Counselor

## 2020-03-19 ENCOUNTER — Telehealth: Payer: Self-pay | Admitting: Genetic Counselor

## 2020-03-19 NOTE — Telephone Encounter (Signed)
I called Ms. Holloran to give her an update on the benefits investigations for the Hirschsprung disease testing panels and the research study we had discussed during her Genetic Counseling appointment on 03/02/20. The benefits investigation through [facility] (the laboratory that offers the [facility] custom panel) was unsuccessful. For this reason, I cannot estimate how much testing would likely cost. The maximum out of pocket cost would be $2000 per person, which Ms. Nightengale indicated would not be possible. Unfortunately, the couple's insurance plan is also out of network with [facility] (the laboratory that offers the Hirschsprung disease genetic testing panel). Per a billing representative from the laboratory, we can request a GAP exception to have services covered at an in network level. We discussed that while I can request this, I cannot guarantee that the patient would not have an out-of-pocket cost even if services are covered given that they will be subject to co-pays, their deductible, etc. Ms. Fentress understood this and requested that I attempt to request the GAP exception, as the maximum out of pocket cost of $890 per person is also out of the realm of possibility for her and her husband.  I also informed Ms. Mccumbers that I reached out to the research laboratory that studies the genetics of Hirschsprung disease. The study coordinator indicated that they accept saliva samples for both adults and children. However, they do not typically release sequencing results directly to their participants. The study coordinator indicated that if clinically actionable results are identified, they will reach out to research participants to help confirm the testing result with a clinically-certified lab. Ms. Orsini was interested in potentially participating in this study given that it will help to advance understanding of Hirschsprung disease. She gave me her permission to provide the study coordinator with her contact  information.  Ms. Reitman also updated me that her OBGYN provider has recommended that she deliver at [facility]. Given her scarring from three prior Cesarean sections, they are reportedly considering performing a vertical Cesarean section for the current pregnancy. While she was not anticipating this change in her delivery plan, she informed me that part of her feels relieved. She had previously informed me that she would plan to deliver at [facility] if genetic testing for herself and her husband indicated that the current fetus was at increased risk for Hirschsprung disease, as her son's surgical team is there. She has already notified her son's surgeon about her pregnancy and plan for delivery just in case. She also indicated that she would be okay with not pursuing genetic testing right now if cost is too high given that she will be delivering at [facility] either way now. She is comforted to know that she is already familiar with providers at [facility] and would not have to see her baby med-flighted to another hospital if she is born with Hirschsprung disease. Ms. Ortego has an ultrasound appointment with [facility] tomorrow. I offered to send my Genetic Counseling note to the genetic counseling team at [facility], which Ms. Bentley appreciated.  Ms. Harron confirmed that she had no further questions for me at this time. I will contact [facility]'s authorization department to request a GAP exception. I will contact Ms. Maudlin once I hear back from them.  Gershon Crane, MS, [facility] Genetic Counselor

## 2020-03-25 ENCOUNTER — Telehealth: Payer: Self-pay | Admitting: Cardiology

## 2020-03-25 NOTE — Telephone Encounter (Signed)
Unscheduled (Alert) Remote loop recorder check 03/21/2020: There was 1 reported symptomatic patient activation + tachy episode detected on 5.29.21 at 13:21. Review of available electrogram demonstrates SVT reported to last 11 minutes 45 seconds with a median V rate 207bpm. Daily activity trends are stable. Heart rate variability trends are stable.  I have personally left message on the patient's voicemail.  Advised if symptoms are getting worse, we will consider EP evaluation and possible ablation.  Yates Decamp, MD, [facility] 03/25/2020, 1:06 PM [facility] Office: [phone]

## 2020-04-17 DIAGNOSIS — Z3689 Encounter for other specified antenatal screening: Secondary | ICD-10-CM | POA: Diagnosis not present

## 2020-04-17 DIAGNOSIS — I519 Heart disease, unspecified: Secondary | ICD-10-CM | POA: Diagnosis not present

## 2020-04-17 DIAGNOSIS — O09293 Supervision of pregnancy with other poor reproductive or obstetric history, third trimester: Secondary | ICD-10-CM | POA: Diagnosis not present

## 2020-04-17 DIAGNOSIS — I340 Nonrheumatic mitral (valve) insufficiency: Secondary | ICD-10-CM | POA: Diagnosis not present

## 2020-04-17 DIAGNOSIS — Z3A31 31 weeks gestation of pregnancy: Secondary | ICD-10-CM | POA: Diagnosis not present

## 2020-04-17 DIAGNOSIS — I471 Supraventricular tachycardia: Secondary | ICD-10-CM | POA: Diagnosis not present

## 2020-04-17 DIAGNOSIS — O99413 Diseases of the circulatory system complicating pregnancy, third trimester: Secondary | ICD-10-CM | POA: Diagnosis not present

## 2020-04-17 DIAGNOSIS — O34219 Maternal care for unspecified type scar from previous cesarean delivery: Secondary | ICD-10-CM | POA: Diagnosis not present

## 2020-04-17 DIAGNOSIS — Z3A30 30 weeks gestation of pregnancy: Secondary | ICD-10-CM | POA: Diagnosis not present

## 2020-04-17 DIAGNOSIS — Z362 Encounter for other antenatal screening follow-up: Secondary | ICD-10-CM | POA: Diagnosis not present

## 2020-04-28 ENCOUNTER — Ambulatory Visit: Payer: 59 | Attending: Obstetrics and Gynecology

## 2020-04-28 ENCOUNTER — Ambulatory Visit: Payer: 59

## 2020-05-15 DIAGNOSIS — O99353 Diseases of the nervous system complicating pregnancy, third trimester: Secondary | ICD-10-CM | POA: Diagnosis not present

## 2020-05-15 DIAGNOSIS — O26893 Other specified pregnancy related conditions, third trimester: Secondary | ICD-10-CM | POA: Diagnosis not present

## 2020-05-15 DIAGNOSIS — F419 Anxiety disorder, unspecified: Secondary | ICD-10-CM | POA: Diagnosis not present

## 2020-05-15 DIAGNOSIS — O219 Vomiting of pregnancy, unspecified: Secondary | ICD-10-CM | POA: Diagnosis not present

## 2020-05-15 DIAGNOSIS — Z3A35 35 weeks gestation of pregnancy: Secondary | ICD-10-CM | POA: Diagnosis not present

## 2020-05-15 DIAGNOSIS — Z3689 Encounter for other specified antenatal screening: Secondary | ICD-10-CM | POA: Diagnosis not present

## 2020-05-15 DIAGNOSIS — G2581 Restless legs syndrome: Secondary | ICD-10-CM | POA: Diagnosis not present

## 2020-05-15 DIAGNOSIS — O34211 Maternal care for low transverse scar from previous cesarean delivery: Secondary | ICD-10-CM | POA: Diagnosis not present

## 2020-05-15 DIAGNOSIS — O99343 Other mental disorders complicating pregnancy, third trimester: Secondary | ICD-10-CM | POA: Diagnosis not present

## 2020-05-15 DIAGNOSIS — R197 Diarrhea, unspecified: Secondary | ICD-10-CM | POA: Diagnosis not present

## 2020-09-14 ENCOUNTER — Other Ambulatory Visit: Payer: Self-pay

## 2020-09-14 ENCOUNTER — Encounter: Payer: Self-pay | Admitting: Cardiology

## 2020-09-14 ENCOUNTER — Ambulatory Visit: Payer: 59 | Admitting: Cardiology

## 2020-09-14 VITALS — BP 127/77 | HR 77 | Resp 16 | Ht 61.0 in | Wt 154.0 lb

## 2020-09-14 DIAGNOSIS — Z8249 Family history of ischemic heart disease and other diseases of the circulatory system: Secondary | ICD-10-CM

## 2020-09-14 DIAGNOSIS — I471 Supraventricular tachycardia, unspecified: Secondary | ICD-10-CM

## 2020-09-14 DIAGNOSIS — I341 Nonrheumatic mitral (valve) prolapse: Secondary | ICD-10-CM

## 2020-09-14 NOTE — Progress Notes (Signed)
Primary Physician/Referring:  Adrienne Mocha, PA  Patient ID: Kelly Haney, female    DOB: 17-Jun-1986, 34 y.o.   MRN: [id number]  Chief Complaint  Patient presents with  . SVT  . Follow-up    1 year   HPI:    HPI: Kelly Haney  is a 34 y.o. with chronic palpitations that have been ongoing since teenage years, Has loop recorder implanted for frequent palpitations on 03/14/2018 and was found to have brief episodes of PSVT, Mitral valve prolapse and mild to moderate mitral regurgitation by echocardiogram in 2019, strong family history of premature coronary artery disease and mother had myocardial infarction at age 60 (SCAD) and then developed CAD and CABG at age 93 or 11 years, died at age 91 (accidental).  She is here on annual follow up visit. She is postpartum 3 months.  Has had brief episodes of palpitations.  She has not had any significant sustained events recently.  Past Medical History:  Diagnosis Date  . Anxiety   . Blood transfusion without reported diagnosis   . Depression    zoloft  . Dysrhythmia    SVT has loop recorder in place  . Encounter for loop recorder check 05/01/2019  . GERD (gastroesophageal reflux disease)   . Hearing loss    right ear  . Hemorrhoids during pregnancy   . History of endometriosis   . Loop in situ: Medtronic Linq for SVT and syncope 03/14/2018   Scheduled Remote loop recorder check  9.2.20:  No significant arrhythmias noted. Has h/o brief SVT. No A. Fib. Continue remote monitoring.  Marland Kitchen MVP (mitral valve prolapse)    Past Surgical History:  Procedure Laterality Date  . CESAREAN SECTION    . CESAREAN SECTION N/A 11/14/2018   Procedure: Repeat CESAREAN SECTION;  Surgeon: Noland Fordyce, MD;  Location: [facility] BIRTHING SUITES;  Service: Obstetrics;  Laterality: N/A;  EDD: 11/19/18 Allergy: Shellfish, Penicillin, Amoxicillin   Social History   Tobacco Use  . Smoking status: Never Smoker  . Smokeless tobacco: Never Used  Substance Use Topics  .  Alcohol use: Not Currently   Marital Status: Married   ROS  Review of Systems  Cardiovascular: Positive for palpitations. Negative for chest pain, dyspnea on exertion and leg swelling.  Gastrointestinal: Negative for melena.   Objective  Blood pressure 127/77, pulse 77, resp. rate 16, height 5\' 1"  (1.549 m), weight 154 lb (69.9 kg), SpO2 98 %, unknown if currently breastfeeding. Body mass index is 29.1 kg/m.   Physical Exam Cardiovascular:     Rate and Rhythm: Normal rate and regular rhythm.     Pulses: Intact distal pulses.     Heart sounds: Normal heart sounds. No murmur heard.  No gallop.      Comments: No leg edema, no JVD. Pulmonary:     Effort: Pulmonary effort is normal.     Breath sounds: Normal breath sounds.  Abdominal:     General: Bowel sounds are normal.     Palpations: Abdomen is soft.    Radiology: No results found.  Laboratory examination:     Medications   Current Outpatient Medications on File Prior to Visit  Medication Sig Dispense Refill  . Ondansetron HCl (ZOFRAN PO) Take by mouth.    . Prenatal Vit-Fe Fumarate-FA (PRENATAL COMPLETE PO) Take 1 tablet by mouth daily.    . Sertraline HCl (ZOLOFT PO) Take by mouth.     No current facility-administered medications on file prior to visit.  Cardiac Studies:   Echocardiogram 08/23/2018: Left ventricle cavity is normal in size. Normal global wall motion. Normal diastolic filling pattern. Calculated EF 55%. Mild prolapse of the mitral valve leaflets. Mild to moderate mitral regurgitation. Trace tricuspid regurgitation. Inadequate tricuspid regurgitation jet to estimate pulmonary artery pressure.  Device Clinic:  Patient activated event 09/05/2020: No SVT.  Normal sinus rhythm.  Scheduled Remote loop recorder check 08/19/2020: :  The presenting rhythm is sinus bradycardia. No symptomatic or automatic episodes were triggered. Daily activity trends are stable. Heart rate variability trends are  stable.  EKG:  EKG 09/14/2020: Normal sinus rhythm at rate of 72 bpm, normal axis, incomplete right bundle branch block.  Normal EKG.    Assessment     ICD-10-CM   1. PSVT (paroxysmal supraventricular tachycardia) (HCC)  I47.1 EKG 12-Lead  2. Mitral valve prolapse  I34.1   3. Family history of premature CAD  Z82.49     Recommendations:   Kelly Haney  is a 34 y.o.  with chronic palpitations that have been ongoing since teenage years, Has loop recorder implanted for frequent palpitations on 03/14/2018 and was found to have brief episodes of PSVT, Mitral valve prolapse and mild to moderate mitral regurgitation by echocardiogram in 2019, strong family history of premature coronary artery disease and mother had myocardial infarction at age 77 (SCAD) and then developed CAD and CABG at age 57 or 66 years, died at age 30 (accidental).  I reviewed her loop recorder, she has not had any significant sustained episodes of palpitations and PSVT episodes.  For now watchful waiting is indicated.  With regard to mitral prolapse and moderate mitral regurgitation, I do not hear any murmur today.  She has mild to moderate MR by echocardiogram.  Do not think she needs repeat echocardiogram.  She has very strong family history of premature coronary disease, aggressive primary prevention is indicated.  Now that she is postpartum, when she stops lactating, consider checking her lipids and have a low threshold to start statin therapy.  Otherwise stable from cardiac standpoint, I will see her back in 2 years for follow-up of PSVT and mitral valve prolapse.     Yates Decamp, MD, [facility] 09/14/2020, 1:44 PM Office: [phone] Pager: [phone]

## 2021-02-20 ENCOUNTER — Encounter: Payer: Self-pay | Admitting: Emergency Medicine

## 2021-02-20 ENCOUNTER — Ambulatory Visit
Admission: EM | Admit: 2021-02-20 | Discharge: 2021-02-20 | Disposition: A | Discharge status: Home / Self Care | Payer: 59 | Attending: Emergency Medicine | Admitting: Emergency Medicine

## 2021-02-20 ENCOUNTER — Other Ambulatory Visit: Payer: Self-pay

## 2021-02-20 DIAGNOSIS — H66001 Acute suppurative otitis media without spontaneous rupture of ear drum, right ear: Secondary | ICD-10-CM | POA: Diagnosis not present

## 2021-02-20 MED ORDER — CEFDINIR 300 MG PO CAPS: 300.0000 mg | ORAL_CAPSULE | Freq: Two times a day (BID) | ORAL | 0 refills | Status: AC

## 2021-02-20 MED ORDER — IBUPROFEN 800 MG PO TABS: 800.0000 mg | ORAL_TABLET | Freq: Three times a day (TID) | ORAL | 0 refills | Status: AC

## 2021-02-20 MED ORDER — FLUTICASONE PROPIONATE 50 MCG/ACT NA SUSP: 1.0000 | Freq: Every day | NASAL | 0 refills | Status: DC

## 2021-02-20 NOTE — Discharge Instructions (Signed)
Omnicef twice daily for 10 days May continue Mucinex, add in Flonase Ibuprofen and Tylenol for pain Follow-up if not improving or worsening

## 2021-02-20 NOTE — ED Triage Notes (Signed)
Pt sts URI sx x 3 weeks with negative covid test; pt sts right ear started having severe pain today

## 2021-02-21 NOTE — ED Provider Notes (Signed)
[facility]    CSN: [id number] Arrival date & time: 02/20/21  1446      History   Chief Complaint Chief Complaint  Patient presents with  . Otalgia    HPI Kelly Haney is a 35 y.o. female history of mitral valve prolapse presenting today for evaluation of URI symptoms.  Reports associated congestion for approximately 3 weeks.  Over the past 1 to 2 days has developed severe pain in right ear.  Denies fevers.  Has had prior COVID testing which was negative.  Denies cough chest pain or shortness of breath.  HPI  Past Medical History:  Diagnosis Date  . Anxiety   . Blood transfusion without reported diagnosis   . Depression    zoloft  . Dysrhythmia    SVT has loop recorder in place  . Encounter for loop recorder check 05/01/2019  . GERD (gastroesophageal reflux disease)   . Hearing loss    right ear  . Hemorrhoids during pregnancy   . History of endometriosis   . Loop in situ: Medtronic Linq for SVT and syncope 03/14/2018   Scheduled Remote loop recorder check  9.2.20:  No significant arrhythmias noted. Has h/o brief SVT. No A. Fib. Continue remote monitoring.  Marland Kitchen MVP (mitral valve prolapse)     Patient Active Problem List   Diagnosis Date Noted  . Palpitations 06/28/2019  . PSVT (paroxysmal supraventricular tachycardia) (HCC) 06/28/2019  . Encounter for loop recorder check 05/01/2019  . Maternal anemia, with delivery 11/16/2018  . S/P cesarean section: Repeat 11/14/2018  . Postpartum care following cesarean delivery (1/22) 11/14/2018  . Previous cesarean section 11/14/2018  . Loop in situ: Medtronic Linq for SVT and syncope 03/14/2018    Past Surgical History:  Procedure Laterality Date  . CESAREAN SECTION    . CESAREAN SECTION N/A 11/14/2018   Procedure: Repeat CESAREAN SECTION;  Surgeon: Noland Fordyce, MD;  Location: [facility] BIRTHING SUITES;  Service: Obstetrics;  Laterality: N/A;  EDD: 11/19/18 Allergy: Shellfish, Penicillin, Amoxicillin    OB History     Gravida  7   Para  3   Term  3   Preterm      AB  3   Living  3     SAB  3   IAB      Ectopic      Multiple  0   Live Births  3            Home Medications    Prior to Admission medications   Medication Sig Start Date End Date Taking? Authorizing Provider  cefdinir (OMNICEF) 300 MG capsule Take 1 capsule (300 mg total) by mouth 2 (two) times daily for 10 days. 02/20/21 03/02/21 Yes Jakita Dutkiewicz C, PA-C  fluticasone (FLONASE) 50 MCG/ACT nasal spray Place 1-2 sprays into both nostrils daily. 02/20/21  Yes Amerigo Mcglory C, PA-C  ibuprofen (ADVIL) 800 MG tablet Take 1 tablet (800 mg total) by mouth 3 (three) times daily. 02/20/21  Yes Lionell Matuszak C, PA-C  Ondansetron HCl (ZOFRAN PO) Take by mouth.    [provider]  Prenatal Vit-Fe Fumarate-FA (PRENATAL COMPLETE PO) Take 1 tablet by mouth daily.    [provider]  Sertraline HCl (ZOLOFT PO) Take by mouth.    [provider]    Family History Family History  Problem Relation Age of Onset  . Heart disease Mother   . Esophageal cancer Father   . Mitral valve prolapse Brother     Social  History Social History   Tobacco Use  . Smoking status: Never Smoker  . Smokeless tobacco: Never Used  Vaping Use  . Vaping Use: Never used  Substance Use Topics  . Alcohol use: Not Currently  . Drug use: Never     Allergies   Bee venom, Shellfish allergy, and Penicillins   Review of Systems Review of Systems  Constitutional: Negative for activity change, appetite change, chills, fatigue and fever.  HENT: Positive for congestion, ear pain, rhinorrhea and sinus pressure. Negative for sore throat and trouble swallowing.   Eyes: Negative for discharge and redness.  Respiratory: Negative for cough, chest tightness and shortness of breath.   Cardiovascular: Negative for chest pain.  Gastrointestinal: Negative for abdominal pain, diarrhea, nausea and vomiting.  Musculoskeletal: Negative  for myalgias.  Skin: Negative for rash.  Neurological: Negative for dizziness, light-headedness and headaches.     Physical Exam Triage Vital Signs ED Triage Vitals [02/20/21 1459]  Enc Vitals Group     BP (!) 140/92     Pulse Rate 85     Resp 18     Temp 98.3 F (36.8 C)     Temp Source Oral     SpO2 96 %     Weight      Height      Head Circumference      Peak Flow      Pain Score 9     Pain Loc      Pain Edu?      Excl. in GC?    No data found.  Updated Vital Signs BP (!) 140/92 (BP Location: Left Arm)   Pulse 85   Temp 98.3 F (36.8 C) (Oral)   Resp 18   SpO2 96%   Visual Acuity Right Eye Distance:   Left Eye Distance:   Bilateral Distance:    Right Eye Near:   Left Eye Near:    Bilateral Near:     Physical Exam Vitals and nursing note reviewed.  Constitutional:      Appearance: She is well-developed.     Comments: No acute distress  HENT:     Head: Normocephalic and atraumatic.     Ears:     Comments: Right TM with mild erythema irregular and slightly dull    Nose: Nose normal.     Mouth/Throat:     Comments: Oral mucosa pink and moist, no tonsillar enlargement or exudate. Posterior pharynx patent and nonerythematous, no uvula deviation or swelling. Normal phonation. Eyes:     Conjunctiva/sclera: Conjunctivae normal.  Cardiovascular:     Rate and Rhythm: Normal rate.  Pulmonary:     Effort: Pulmonary effort is normal. No respiratory distress.     Comments: Breathing comfortably at rest, CTABL, no wheezing, rales or other adventitious sounds auscultated Abdominal:     General: There is no distension.  Musculoskeletal:        General: Normal range of motion.     Cervical back: Neck supple.  Skin:    General: Skin is warm and dry.  Neurological:     Mental Status: She is alert and oriented to person, place, and time.      UC Treatments / Results  Labs (all labs ordered are listed, but only abnormal results are displayed) Labs Reviewed  - No data to display  EKG   Radiology No results found.  Procedures Procedures (including critical care time)  Medications Ordered in UC Medications - No data to display  Initial  Impression / Assessment and Plan / UC Course  I have reviewed the triage vital signs and the nursing notes.  Pertinent labs & imaging results that were available during my care of the patient were reviewed by me and considered in my medical decision making (see chart for details).     Treating for sinusitis/otitis media with Omnicef and symptomatic and supportive care as well.  Patient is breast-feeding.  Discussed strict return precautions. Patient verbalized understanding and is agreeable with plan.  Final Clinical Impressions(s) / UC Diagnoses   Final diagnoses:  Non-recurrent acute suppurative otitis media of right ear without spontaneous rupture of tympanic membrane     Discharge Instructions     Omnicef twice daily for 10 days May continue Mucinex, add in Flonase Ibuprofen and Tylenol for pain Follow-up if not improving or worsening    ED Prescriptions    Medication Sig Dispense Auth. Provider   ibuprofen (ADVIL) 800 MG tablet Take 1 tablet (800 mg total) by mouth 3 (three) times daily. 21 tablet Crystel Demarco C, PA-C   fluticasone (FLONASE) 50 MCG/ACT nasal spray Place 1-2 sprays into both nostrils daily. 16 g Ceceilia Cephus C, PA-C   cefdinir (OMNICEF) 300 MG capsule Take 1 capsule (300 mg total) by mouth 2 (two) times daily for 10 days. 20 capsule Elianie Hubers, [place] C, PA-C     PDMP not reviewed this encounter.   Lew Dawes, [place] 02/21/21 [phone]

## 2021-03-02 DIAGNOSIS — H65191 Other acute nonsuppurative otitis media, right ear: Secondary | ICD-10-CM | POA: Diagnosis not present

## 2021-03-02 DIAGNOSIS — H9191 Unspecified hearing loss, right ear: Secondary | ICD-10-CM | POA: Diagnosis not present

## 2021-03-19 DIAGNOSIS — H9121 Sudden idiopathic hearing loss, right ear: Secondary | ICD-10-CM | POA: Diagnosis not present

## 2021-04-16 DIAGNOSIS — H9041 Sensorineural hearing loss, unilateral, right ear, with unrestricted hearing on the contralateral side: Secondary | ICD-10-CM | POA: Diagnosis not present

## 2021-04-19 NOTE — Telephone Encounter (Signed)
From pt

## 2021-09-02 ENCOUNTER — Emergency Department (HOSPITAL_COMMUNITY)
Admission: EM | Admit: 2021-09-02 | Discharge: 2021-09-02 | Disposition: A | Discharge status: Home / Self Care | Payer: 59 | Attending: Emergency Medicine | Admitting: Emergency Medicine

## 2021-09-02 ENCOUNTER — Other Ambulatory Visit: Payer: Self-pay

## 2021-09-02 ENCOUNTER — Encounter (HOSPITAL_COMMUNITY): Payer: Self-pay | Admitting: Oncology

## 2021-09-02 DIAGNOSIS — R42 Dizziness and giddiness: Secondary | ICD-10-CM | POA: Insufficient documentation

## 2021-09-02 DIAGNOSIS — Z20822 Contact with and (suspected) exposure to covid-19: Secondary | ICD-10-CM | POA: Diagnosis not present

## 2021-09-02 DIAGNOSIS — R1084 Generalized abdominal pain: Secondary | ICD-10-CM | POA: Insufficient documentation

## 2021-09-02 DIAGNOSIS — R55 Syncope and collapse: Secondary | ICD-10-CM | POA: Diagnosis not present

## 2021-09-02 DIAGNOSIS — R197 Diarrhea, unspecified: Secondary | ICD-10-CM | POA: Diagnosis not present

## 2021-09-02 LAB — COMPREHENSIVE METABOLIC PANEL
ALT: 9 U/L (ref 0–44)
AST: 18 U/L (ref 15–41)
Albumin: 4.9 g/dL (ref 3.5–5.0)
Alkaline Phosphatase: 80 U/L (ref 38–126)
Anion gap: 7 (ref 5–15)
BUN: 12 mg/dL (ref 6–20)
CO2: 28 mmol/L (ref 22–32)
Calcium: 9.4 mg/dL (ref 8.9–10.3)
Chloride: 103 mmol/L (ref 98–111)
Creatinine, Ser: 0.72 mg/dL (ref 0.44–1.00)
GFR, Estimated: >60 mL/min (ref >60)
Glucose, Bld: 94 mg/dL (ref 70–99)
Potassium: 4.2 mmol/L (ref 3.5–5.1)
Sodium: 138 mmol/L (ref 135–145)
Total Bilirubin: 0.6 mg/dL (ref 0.3–1.2)
Total Protein: 8.1 g/dL (ref 6.5–8.1)

## 2021-09-02 LAB — CBC WITH DIFFERENTIAL/PLATELET
Abs Immature Granulocytes: 0.03 10*3/uL (ref 0.00–0.07)
Basophils Absolute: 0 10*3/uL (ref 0.0–0.1)
Basophils Relative: 0 %
Eosinophils Absolute: 0.1 10*3/uL (ref 0.0–0.5)
Eosinophils Relative: 1 %
HCT: 39.8 % (ref 36.0–46.0)
Hemoglobin: 12.8 g/dL (ref 12.0–15.0)
Immature Granulocytes: 0 %
Lymphocytes Relative: 33 %
Lymphs Abs: 2.3 10*3/uL (ref 0.7–4.0)
MCH: 30 pg (ref 26.0–34.0)
MCHC: 32.2 g/dL (ref 30.0–36.0)
MCV: 93.4 fL (ref 80.0–100.0)
Monocytes Absolute: 0.5 10*3/uL (ref 0.1–1.0)
Monocytes Relative: 7 %
Neutro Abs: 4 10*3/uL (ref 1.7–7.7)
Neutrophils Relative %: 59 %
Platelets: 315 10*3/uL (ref 150–400)
RBC: 4.26 MIL/uL (ref 3.87–5.11)
RDW: 13.1 % (ref 11.5–15.5)
WBC: 7 10*3/uL (ref 4.0–10.5)
nRBC: 0 % (ref 0.0–0.2)

## 2021-09-02 LAB — RESP PANEL BY RT-PCR (FLU A&B, COVID) ARPGX2
Influenza A by PCR: NEGATIVE
Influenza B by PCR: NEGATIVE
SARS Coronavirus 2 by RT PCR: NEGATIVE

## 2021-09-02 LAB — PREGNANCY, URINE: Preg Test, Ur: NEGATIVE

## 2021-09-02 LAB — URINALYSIS, ROUTINE W REFLEX MICROSCOPIC
Bilirubin Urine: NEGATIVE
Glucose, UA: NEGATIVE mg/dL
Hgb urine dipstick: NEGATIVE
Ketones, ur: NEGATIVE mg/dL
Leukocytes,Ua: NEGATIVE
Nitrite: NEGATIVE
Protein, ur: NEGATIVE mg/dL
Specific Gravity, Urine: 1.008 (ref 1.005–1.030)
pH: 6 (ref 5.0–8.0)

## 2021-09-02 LAB — LIPASE, BLOOD: Lipase: 28 U/L (ref 11–51)

## 2021-09-02 MED ORDER — ONDANSETRON HCL 4 MG/2ML IJ SOLN
4.0000 mg | Freq: Once | INTRAMUSCULAR | Status: AC
  Administered 2021-09-02: 4 mg via INTRAVENOUS
  Filled 2021-09-02: qty 2

## 2021-09-02 MED ORDER — LACTATED RINGERS IV BOLUS
1000.0000 mL | Freq: Once | INTRAVENOUS | Status: AC
  Administered 2021-09-02: 1000 mL via INTRAVENOUS

## 2021-09-02 MED ORDER — ONDANSETRON 4 MG PO TBDP: 4.0000 mg | ORAL_TABLET | Freq: Three times a day (TID) | ORAL | 0 refills | Status: DC | PRN

## 2021-09-02 NOTE — ED Provider Notes (Signed)
[facility] EMERGENCY DEPARTMENT Provider Note  CSN: [id number] Arrival date & time: 09/02/21 1213    History Chief Complaint  Patient presents with   Abdominal Pain   Loss of Consciousness    Kelly Haney is a 35 y.o. female reports she has had a diarrheal illness for the last 3-4 days with frequent watery, non blood stools and nausea with no vomiting. She reports this morning she was bending over to pick something up when she had loss of consciousness. Found on the floor by SO but they are unsure how long she was unconscious for. She still feels dizzy with bending over but no chest pain, fever or SOB. She is nursing. She has had decreased urine output. She has had some mild diffuse abdominal tenderness.    Past Medical History:  Diagnosis Date   Anxiety    Blood transfusion without reported diagnosis    Depression    zoloft   Dysrhythmia    SVT has loop recorder in place   Encounter for loop recorder check 05/01/2019   GERD (gastroesophageal reflux disease)    Hearing loss    right ear   Hemorrhoids during pregnancy    History of endometriosis    Loop in situ: Medtronic Linq for SVT and syncope 03/14/2018   Scheduled Remote loop recorder check  9.2.20:  No significant arrhythmias noted. Has h/o brief SVT. No A. Fib. Continue remote monitoring.   MVP (mitral valve prolapse)     Past Surgical History:  Procedure Laterality Date   CESAREAN SECTION     CESAREAN SECTION N/A 11/14/2018   Procedure: Repeat CESAREAN SECTION;  Surgeon: Noland Fordyce, MD;  Location: [facility] BIRTHING SUITES;  Service: Obstetrics;  Laterality: N/A;  EDD: 11/19/18 Allergy: Shellfish, Penicillin, Amoxicillin    Family History  Problem Relation Age of Onset   Heart disease Mother    Esophageal cancer Father    Mitral valve prolapse Brother     Social History   Tobacco Use   Smoking status: Never   Smokeless tobacco: Never  Vaping Use   Vaping Use: Never used  Substance Use Topics   Alcohol  use: Not Currently   Drug use: Never     Home Medications Prior to Admission medications   Medication Sig Start Date End Date Taking? Authorizing Provider  ondansetron (ZOFRAN ODT) 4 MG disintegrating tablet Take 1 tablet (4 mg total) by mouth every 8 (eight) hours as needed for nausea or vomiting. 09/02/21  Yes Pollyann Savoy, MD  fluticasone [facility]) 50 MCG/ACT nasal spray Place 1-2 sprays into both nostrils daily. 02/20/21   Wieters, Hallie C, PA-C  ibuprofen (ADVIL) 800 MG tablet Take 1 tablet (800 mg total) by mouth 3 (three) times daily. 02/20/21   Wieters, Hallie C, PA-C  Prenatal Vit-Fe Fumarate-FA (PRENATAL COMPLETE PO) Take 1 tablet by mouth daily.    [provider]  Sertraline HCl (ZOLOFT PO) Take by mouth.    [provider]     Allergies    Bee venom, Shellfish allergy, and Penicillins   Review of Systems   Review of Systems A comprehensive review of systems was completed and negative except as noted in HPI.    Physical Exam BP 133/73   Pulse 62   Temp 99 F (37.2 C) (Oral)   Resp 17   Ht 5\' 1"  (1.549 m)   Wt 69.4 kg   LMP 08/15/2021 (Exact Date)   SpO2 100%   BMI 28.91 kg/m  Physical Exam Vitals and nursing note reviewed.  Constitutional:      Appearance: Normal appearance.  HENT:     Head: Normocephalic and atraumatic.     Nose: Nose normal.     Mouth/Throat:     Mouth: Mucous membranes are dry.  Eyes:     Extraocular Movements: Extraocular movements intact.     Conjunctiva/sclera: Conjunctivae normal.  Cardiovascular:     Rate and Rhythm: Normal rate.  Pulmonary:     Effort: Pulmonary effort is normal.     Breath sounds: Normal breath sounds.  Abdominal:     General: Abdomen is flat.     Palpations: Abdomen is soft.     Tenderness: There is generalized abdominal tenderness.  Musculoskeletal:        General: No swelling. Normal range of motion.     Cervical back: Neck supple.  Skin:    General: Skin is warm and dry.   Neurological:     General: No focal deficit present.     Mental Status: She is alert.  Psychiatric:        Mood and Affect: Mood normal.     ED Results / Procedures / Treatments   Labs (all labs ordered are listed, but only abnormal results are displayed) Labs Reviewed  URINALYSIS, ROUTINE W REFLEX MICROSCOPIC - Abnormal; Notable for the following components:      Result Value   Color, Urine STRAW (*)    All other components within normal limits  RESP PANEL BY RT-PCR (FLU A&B, COVID) ARPGX2  CBC WITH DIFFERENTIAL/PLATELET  COMPREHENSIVE METABOLIC PANEL  LIPASE, BLOOD  PREGNANCY, URINE    EKG EKG Interpretation  Date/Time:  Thursday September 02 2021 15:26:54 EST Ventricular Rate:  71 PR Interval:  171 QRS Duration: 97 QT Interval:  388 QTC Calculation: 422 R Axis:   62 Text Interpretation: Sinus rhythm RSR' in V1 or V2, right VCD or RVH No old tracing to compare Confirmed by Susy Frizzle [phone]) on 09/02/2021 3:34:54 PM  Radiology No results found.  Procedures Procedures  Medications Ordered in the ED Medications  lactated ringers bolus 1,000 mL (0 mLs Intravenous Stopped 09/02/21 1712)  ondansetron (ZOFRAN) injection 4 mg (4 mg Intravenous Given 09/02/21 1546)     MDM Rules/Calculators/A&P MDM Patient with syncope in setting of diarrheal illness and signs of dehydration. Currently feeling better. Labs done in triage reviewed, CBC, CMP ,lipase are normal. UA is pending. Will give IVF. Zofran and reassess.   ED Course  I have reviewed the triage vital signs and the nursing notes.  Pertinent labs & imaging results that were available during my care of the patient were reviewed by me and considered in my medical decision making (see chart for details).  Clinical Course as of 09/02/21 1733  Thu Sep 02, 2021  1557 UA is normal. HCG is neg.  [CS]  1621 Covid/Flu is neg.  [CS]  1730 Patient able to drink fluids and walk without difficulty. Recommend OTC  imodium for diarrhea, Rx Zofran for nausea, encouraged to drink plenty of fluids. PCP follow up or RTED for any other concerns.  [CS]    Clinical Course User Index [CS] Pollyann Savoy, MD    Final Clinical Impression(s) / ED Diagnoses Final diagnoses:  Diarrhea, unspecified type  Syncope, unspecified syncope type    Rx / DC Orders ED Discharge Orders          Ordered    ondansetron (ZOFRAN ODT) 4 MG disintegrating tablet  Every 8 hours PRN        09/02/21 1733             Pollyann Savoy, MD 09/02/21 [phone]

## 2021-09-02 NOTE — ED Triage Notes (Signed)
Pt presents d/t abdominal pain, nausea and diarrhea x 3 days.  Pt had an episode of syncope today.  States she bent over to pick something up being the last thing she remembers prior to syncope.

## 2021-09-02 NOTE — ED Provider Notes (Signed)
Emergency Medicine Provider Triage Evaluation Note  Kelly Haney , a 35 y.o. female  was evaluated in triage.  Pt complains of syncope. Pts significant other states he found her passed out on the floor pta. States she was confused for several minutes after this. Pt states all she remembers is that she bent forward to pick something up and the next think she knew she was on the floor and her significant other was waking her up.  Pt states she had has decreased po intake and diarrhea for the last few days.   She denies palpitations, sob, cp.  Review of Systems  Positive: Syncope, decreased po intake, diarrhea Negative: Palpitations, sob, cp  Physical Exam  BP 121/86 (BP Location: Left Arm)   Pulse 75   Temp 99 F (37.2 C) (Oral)   Resp 18   SpO2 100%  Gen:   Awake, no distress   Resp:  Normal effort  MSK:   Moves extremities without difficulty  Other:  Clear speech  Medical Decision Making  Medically screening exam initiated at 12:30 PM.  Appropriate orders placed.  Kelly Haney was informed that the remainder of the evaluation will be completed by another provider, this initial triage assessment does not replace that evaluation, and the importance of remaining in the ED until their evaluation is complete.     Rayne Du 09/02/21 1230    Benjiman Core, MD 09/03/21 [phone]

## 2021-09-03 ENCOUNTER — Telehealth: Payer: Self-pay

## 2021-09-03 NOTE — Telephone Encounter (Signed)
Transition Care Management Follow-up Telephone Call Date of discharge and from where: 09/02/2021-[facility]  How have you been since you were released from the hospital? Patient stated she is doing fine.  Any questions or concerns? No  Items Reviewed: Did the pt receive and understand the discharge instructions provided? Yes  Medications obtained and verified? Yes  Other? No  Any new allergies since your discharge? No  Dietary orders reviewed? No Do you have support at home? Yes   Home Care and Equipment/Supplies: Were home health services ordered? not applicable If so, what is the name of the agency? N/A  Has the agency set up a time to come to the patient's home? not applicable Were any new equipment or medical supplies ordered?  No What is the name of the medical supply agency? N/A Were you able to get the supplies/equipment? not applicable Do you have any questions related to the use of the equipment or supplies? No  Functional Questionnaire: (I = Independent and D = Dependent) ADLs: I  Bathing/Dressing- I  Meal Prep- I  Eating- I  Maintaining continence- I  Transferring/Ambulation- I  Managing Meds- I  Follow up appointments reviewed:  PCP Hospital f/u appt confirmed? No   Specialist Hospital f/u appt confirmed? No   Are transportation arrangements needed? No  If their condition worsens, is the pt aware to call PCP or go to the Emergency Dept.? Yes Was the patient provided with contact information for the PCP's office or ED? Yes Was to pt encouraged to call back with questions or concerns? Yes  Patient declined referral for assistance with finding a PCP. Stated she is already being connected with a new PCP.

## 2021-09-06 NOTE — Telephone Encounter (Signed)
From pt

## 2021-09-23 DIAGNOSIS — R635 Abnormal weight gain: Secondary | ICD-10-CM | POA: Diagnosis not present

## 2021-09-23 DIAGNOSIS — H9191 Unspecified hearing loss, right ear: Secondary | ICD-10-CM | POA: Diagnosis not present

## 2021-09-23 DIAGNOSIS — E7849 Other hyperlipidemia: Secondary | ICD-10-CM | POA: Diagnosis not present

## 2021-09-23 DIAGNOSIS — Z0000 Encounter for general adult medical examination without abnormal findings: Secondary | ICD-10-CM | POA: Diagnosis not present

## 2021-09-23 DIAGNOSIS — Z8679 Personal history of other diseases of the circulatory system: Secondary | ICD-10-CM | POA: Diagnosis not present

## 2021-09-23 DIAGNOSIS — F411 Generalized anxiety disorder: Secondary | ICD-10-CM | POA: Diagnosis not present

## 2021-10-29 ENCOUNTER — Ambulatory Visit
Admission: EM | Admit: 2021-10-29 | Discharge: 2021-10-29 | Disposition: A | Discharge status: Home / Self Care | Payer: 59 | Attending: Physician Assistant | Admitting: Physician Assistant

## 2021-10-29 ENCOUNTER — Other Ambulatory Visit: Payer: Self-pay

## 2021-10-29 DIAGNOSIS — J029 Acute pharyngitis, unspecified: Secondary | ICD-10-CM

## 2021-10-29 LAB — POCT RAPID STREP A (OFFICE): Rapid Strep A Screen: NEGATIVE

## 2021-10-29 MED ORDER — PREDNISONE 20 MG PO TABS: 40.0000 mg | ORAL_TABLET | Freq: Every day | ORAL | 0 refills | Status: AC

## 2021-10-29 NOTE — ED Provider Notes (Signed)
[facility]    CSN: [id number] Arrival date & time: 10/29/21  1019      History   Chief Complaint Chief Complaint  Patient presents with   Sore Throat    HPI Kelly Haney is a 36 y.o. female.   Patient here today for evaluation of sore throat she has had for the last month. She reports that yawning makes sore throat worse. She feels her throat has worsened with time. She is concerned because she has significant family history of throat cancer. She did have fever once several weeks ago but nothing since then  The history is provided by the patient.  Sore Throat Pertinent negatives include no abdominal pain and no shortness of breath.   Past Medical History:  Diagnosis Date   Anxiety    Blood transfusion without reported diagnosis    Depression    zoloft   Dysrhythmia    SVT has loop recorder in place   Encounter for loop recorder check 05/01/2019   GERD (gastroesophageal reflux disease)    Hearing loss    right ear   Hemorrhoids during pregnancy    History of endometriosis    Loop in situ: Medtronic Linq for SVT and syncope 03/14/2018   Scheduled Remote loop recorder check  9.2.20:  No significant arrhythmias noted. Has h/o brief SVT. No A. Fib. Continue remote monitoring.   MVP (mitral valve prolapse)     Patient Active Problem List   Diagnosis Date Noted   Palpitations 06/28/2019   PSVT (paroxysmal supraventricular tachycardia) (HCC) 06/28/2019   Encounter for loop recorder check 05/01/2019   Maternal anemia, with delivery 11/16/2018   S/P cesarean section: Repeat 11/14/2018   Postpartum care following cesarean delivery (1/22) 11/14/2018   Previous cesarean section 11/14/2018   Loop in situ: Medtronic Linq for SVT and syncope 03/14/2018    Past Surgical History:  Procedure Laterality Date   CESAREAN SECTION     CESAREAN SECTION N/A 11/14/2018   Procedure: Repeat CESAREAN SECTION;  Surgeon: Noland FordyceFogleman, Kelly, MD;  [facility];   Service: Obstetrics;  Laterality: N/A;  EDD: 11/19/18 Allergy: Shellfish, Penicillin, Amoxicillin    OB History     Gravida  7   Para  3   Term  3   Preterm      AB  3   Living  3      SAB  3   IAB      Ectopic      Multiple  0   Live Births  3            Home Medications    Prior to Admission medications   Medication Sig Start Date End Date Taking? Authorizing Provider  predniSONE (DELTASONE) 20 MG tablet Take 2 tablets (40 mg total) by mouth daily with breakfast for 5 days. 10/29/21 11/03/21 Yes Tomi BambergerMyers, Shaasia Odle F, PA-C  fluticasone (FLONASE) 50 MCG/ACT nasal spray Place 1-2 sprays into both nostrils daily. 02/20/21   Wieters, Hallie C, PA-C  ibuprofen (ADVIL) 800 MG tablet Take 1 tablet (800 mg total) by mouth 3 (three) times daily. 02/20/21   Wieters, Hallie C, PA-C  ondansetron (ZOFRAN ODT) 4 MG disintegrating tablet Take 1 tablet (4 mg total) by mouth every 8 (eight) hours as needed for nausea or vomiting. 09/02/21   Pollyann SavoySheldon, Charles B, MD  Prenatal Vit-Fe Fumarate-FA (PRENATAL COMPLETE PO) Take 1 tablet by mouth daily.    [provider]  Sertraline HCl (ZOLOFT PO) Take by  mouth.    [provider]    Family History Family History  Problem Relation Age of Onset   Heart disease Mother    Esophageal cancer Father    Mitral valve prolapse Brother     Social History Social History   Tobacco Use   Smoking status: Never   Smokeless tobacco: Never  Vaping Use   Vaping Use: Never used  Substance Use Topics   Alcohol use: Not Currently   Drug use: Never     Allergies   Bee venom, Shellfish allergy, and Penicillins   Review of Systems Review of Systems  Constitutional:  Negative for chills and fever.  HENT:  Positive for sore throat. Negative for congestion, ear pain and sinus pressure.   Eyes:  Negative for discharge and redness.  Respiratory:  Negative for cough, shortness of breath and wheezing.   Gastrointestinal:  Negative  for abdominal pain, diarrhea, nausea and vomiting.    Physical Exam Triage Vital Signs ED Triage Vitals  Enc Vitals Group     BP 10/29/21 1236 (!) 142/80     Pulse Rate 10/29/21 1236 69     Resp 10/29/21 1236 18     Temp 10/29/21 1236 98.4 F (36.9 C)     Temp Source 10/29/21 1236 Oral     SpO2 10/29/21 1236 97 %     Weight --      Height --      Head Circumference --      Peak Flow --      Pain Score 10/29/21 1239 10     Pain Loc --      Pain Edu? --      Excl. in GC? --    No data found.  Updated Vital Signs BP (!) 142/80 (BP Location: Right Arm)    Pulse 69    Temp 98.4 F (36.9 C) (Oral)    Resp 18    SpO2 97%    Breastfeeding Yes Comment: tubal ligation     Physical Exam Vitals and nursing note reviewed.  Constitutional:      General: She is not in acute distress.    Appearance: Normal appearance. She is not ill-appearing.  HENT:     Head: Normocephalic and atraumatic.     Nose: No congestion or rhinorrhea.     Mouth/Throat:     Mouth: Mucous membranes are moist.     Pharynx: No oropharyngeal exudate or posterior oropharyngeal erythema.  Eyes:     Conjunctiva/sclera: Conjunctivae normal.  Cardiovascular:     Rate and Rhythm: Normal rate and regular rhythm.     Heart sounds: Normal heart sounds. No murmur heard. Pulmonary:     Effort: Pulmonary effort is normal. No respiratory distress.     Breath sounds: Normal breath sounds. No wheezing, rhonchi or rales.  Skin:    General: Skin is warm and dry.  Neurological:     Mental Status: She is alert.  Psychiatric:        Mood and Affect: Mood normal.        Thought Content: Thought content normal.     UC Treatments / Results  Labs (all labs ordered are listed, but only abnormal results are displayed) Labs Reviewed  CULTURE, GROUP A STREP [facility])  POCT RAPID STREP A (OFFICE)    EKG   Radiology No results found.  Procedures Procedures (including critical care time)  Medications Ordered in  UC Medications - No data to display  Initial Impression /  Assessment and Plan / UC Course  I have reviewed the triage vital signs and the nursing notes.  Pertinent labs & imaging results that were available during my care of the patient were reviewed by me and considered in my medical decision making (see chart for details).   Prednisone prescribed for hopeful improvement of symptoms and contact information provided for ENT. Encouraged follow up with any further concerns.    Final Clinical Impressions(s) / UC Diagnoses   Final diagnoses:  Acute pharyngitis, unspecified etiology     Discharge Instructions        Try CEPACOL lozenges.      ED Prescriptions     Medication Sig Dispense Auth. Provider   predniSONE (DELTASONE) 20 MG tablet Take 2 tablets (40 mg total) by mouth daily with breakfast for 5 days. 10 tablet Tomi Bamberger, PA-C      PDMP not reviewed this encounter.   Tomi Bamberger, PA-C 10/29/21 1429

## 2021-10-29 NOTE — ED Triage Notes (Signed)
Over 53mo h/o sore throat that has significantly worsened within the last week. Yawning aggravates sxs and throat is tender to the touch. Pt reports she is eating and drinking less due to dysphagia. Notes some right ear discomfort and cough that mostly at night. Pt noticed a "spot" on her throat this morning. Has tried cough drops and gargling w/o relief.    Pt is breastfeeding.

## 2021-10-29 NOTE — Discharge Instructions (Addendum)
° °  Try CEPACOL lozenges.  

## 2021-11-01 ENCOUNTER — Encounter: Payer: Self-pay | Admitting: Cardiology

## 2021-11-01 ENCOUNTER — Other Ambulatory Visit: Payer: Self-pay | Admitting: Physician Assistant

## 2021-11-01 DIAGNOSIS — J384 Edema of larynx: Secondary | ICD-10-CM | POA: Diagnosis not present

## 2021-11-01 DIAGNOSIS — J312 Chronic pharyngitis: Secondary | ICD-10-CM

## 2021-11-01 LAB — CULTURE, GROUP A STREP (THRC)

## 2021-11-03 NOTE — Telephone Encounter (Signed)
From pt

## 2021-11-23 ENCOUNTER — Ambulatory Visit
Admission: RE | Admit: 2021-11-23 | Discharge: 2021-11-23 | Disposition: A | Discharge status: Home / Self Care | Payer: 59 | Source: Ambulatory Visit | Attending: Physician Assistant | Admitting: Physician Assistant

## 2021-11-23 ENCOUNTER — Other Ambulatory Visit: Payer: Self-pay

## 2021-11-23 DIAGNOSIS — J312 Chronic pharyngitis: Secondary | ICD-10-CM

## 2021-11-23 MED ORDER — IOPAMIDOL (ISOVUE-300) INJECTION 61%
100.0000 mL | Freq: Once | INTRAVENOUS | Status: AC | PRN
  Administered 2021-11-23: 100 mL via INTRAVENOUS

## 2021-12-16 DIAGNOSIS — L255 Unspecified contact dermatitis due to plants, except food: Secondary | ICD-10-CM | POA: Diagnosis not present

## 2021-12-16 DIAGNOSIS — S63502A Unspecified sprain of left wrist, initial encounter: Secondary | ICD-10-CM | POA: Diagnosis not present

## 2022-03-24 DIAGNOSIS — F411 Generalized anxiety disorder: Secondary | ICD-10-CM | POA: Diagnosis not present

## 2022-03-24 DIAGNOSIS — F321 Major depressive disorder, single episode, moderate: Secondary | ICD-10-CM | POA: Diagnosis not present

## 2022-03-24 DIAGNOSIS — E7849 Other hyperlipidemia: Secondary | ICD-10-CM | POA: Diagnosis not present

## 2022-03-24 DIAGNOSIS — Z8679 Personal history of other diseases of the circulatory system: Secondary | ICD-10-CM | POA: Diagnosis not present

## 2022-03-24 DIAGNOSIS — G479 Sleep disorder, unspecified: Secondary | ICD-10-CM | POA: Diagnosis not present

## 2022-06-04 ENCOUNTER — Encounter (HOSPITAL_COMMUNITY): Payer: Self-pay | Admitting: *Deleted

## 2022-06-04 ENCOUNTER — Emergency Department (HOSPITAL_COMMUNITY)
Admission: EM | Admit: 2022-06-04 | Discharge: 2022-06-04 | Disposition: A | Discharge status: Home / Self Care | Payer: 59 | Attending: Emergency Medicine | Admitting: Emergency Medicine

## 2022-06-04 ENCOUNTER — Other Ambulatory Visit: Payer: Self-pay

## 2022-06-04 DIAGNOSIS — T63451A Toxic effect of venom of hornets, accidental (unintentional), initial encounter: Secondary | ICD-10-CM | POA: Diagnosis not present

## 2022-06-04 DIAGNOSIS — T7840XA Allergy, unspecified, initial encounter: Secondary | ICD-10-CM | POA: Insufficient documentation

## 2022-06-04 LAB — I-STAT BETA HCG BLOOD, ED (MC, WL, AP ONLY): I-stat hCG, quantitative: <5 m[IU]/mL (ref <5)

## 2022-06-04 MED ORDER — DIPHENHYDRAMINE HCL 50 MG/ML IJ SOLN
25.0000 mg | Freq: Once | INTRAMUSCULAR | Status: AC
  Administered 2022-06-04: 25 mg via INTRAVENOUS
  Filled 2022-06-04: qty 1

## 2022-06-04 MED ORDER — FAMOTIDINE IN NACL 20-0.9 MG/50ML-% IV SOLN
20.0000 mg | Freq: Once | INTRAVENOUS | Status: AC
  Administered 2022-06-04: 20 mg via INTRAVENOUS
  Filled 2022-06-04: qty 50

## 2022-06-04 MED ORDER — LACTATED RINGERS IV BOLUS
1000.0000 mL | Freq: Once | INTRAVENOUS | Status: AC
  Administered 2022-06-04: 1000 mL via INTRAVENOUS

## 2022-06-04 MED ORDER — EPINEPHRINE 0.3 MG/0.3ML IJ SOAJ: 0.3000 mg | INTRAMUSCULAR | 0 refills | Status: AC | PRN

## 2022-06-04 MED ORDER — ONDANSETRON HCL 4 MG/2ML IJ SOLN
4.0000 mg | Freq: Once | INTRAMUSCULAR | Status: AC
  Administered 2022-06-04: 4 mg via INTRAVENOUS
  Filled 2022-06-04: qty 2

## 2022-06-04 MED ORDER — ACETAMINOPHEN 325 MG PO TABS
650.0000 mg | ORAL_TABLET | ORAL | Status: AC
  Administered 2022-06-04: 650 mg via ORAL
  Filled 2022-06-04: qty 2

## 2022-06-04 MED ORDER — METHYLPREDNISOLONE SODIUM SUCC 125 MG IJ SOLR
125.0000 mg | Freq: Once | INTRAMUSCULAR | Status: AC
  Administered 2022-06-04: 125 mg via INTRAVENOUS
  Filled 2022-06-04: qty 2

## 2022-06-04 MED ORDER — KETOROLAC TROMETHAMINE 15 MG/ML IJ SOLN
15.0000 mg | Freq: Once | INTRAMUSCULAR | Status: AC
  Administered 2022-06-04: 15 mg via INTRAVENOUS
  Filled 2022-06-04: qty 1

## 2022-06-04 NOTE — Discharge Instructions (Addendum)
Today you were seen in the emergency department for your allergic reaction.    In the emergency department you received IV fluids, antihistamines, and steroids.  You did not require an EpiPen in the emergency department.  At home, please fill the prescription for the EpiPen that we sent to your pharmacy.  Use Tylenol, Motrin, and ice for any pain that you have around the sting site.  Follow-up with your primary doctor in 2-3 days regarding your visit.  Please also schedule follow-up with an allergist.  Return immediately to the emergency department if you experience any of the following: Difficulty breathing, hives, dizziness, or any other concerning symptoms.    Thank you for visiting our Emergency Department. It was a pleasure taking care of you today.

## 2022-06-04 NOTE — ED Provider Notes (Signed)
[facility] EMERGENCY DEPARTMENT Provider Note   CSN: [id number] Arrival date & time: 06/04/22  1321     History {Add pertinent medical, surgical, social history, OB history to HPI:1} Chief Complaint  Patient presents with   Allergic Reaction    Erisa Mehlman is a 36 y.o. female.  36 year old female with a history of yellowjacket allergies who presents to the emergency department after being stung by a wasp today.  Patient states that she was working outside and got stung on her right hand through a glove.  Says that the insect was a black wasp.  Says that she has never been stung by these before but does have a history of allergies to yellow jackets that have required epinephrine in the past.  Says that she was feeling itchy on her right arm and nausea so decided to come in for evaluation.  No shortness of breath, vomiting, diarrhea.  Did feel lightheaded initially but is not at this time.  Did not take any medications prior to arrival.   Allergic Reaction      Home Medications Prior to Admission medications   Medication Sig Start Date End Date Taking? Authorizing Provider  fluticasone (FLONASE) 50 MCG/ACT nasal spray Place 1-2 sprays into both nostrils daily. 02/20/21   Wieters, Hallie C, PA-C  ibuprofen (ADVIL) 800 MG tablet Take 1 tablet (800 mg total) by mouth 3 (three) times daily. 02/20/21   Wieters, Hallie C, PA-C  ondansetron (ZOFRAN ODT) 4 MG disintegrating tablet Take 1 tablet (4 mg total) by mouth every 8 (eight) hours as needed for nausea or vomiting. 09/02/21   Pollyann Savoy, MD  Prenatal Vit-Fe Fumarate-FA (PRENATAL COMPLETE PO) Take 1 tablet by mouth daily.    [provider]  Sertraline HCl (ZOLOFT PO) Take by mouth.    [provider]      Allergies    Bee venom, Shellfish allergy, and Penicillins    Review of Systems   Review of Systems  Physical Exam Updated Vital Signs BP (!) 141/99 (BP Location: Left Arm)   Pulse  75   Temp 98.9 F (37.2 C) (Oral)   Resp 18   Ht 5\' 1"  (1.549 m)   Wt 69.4 kg   LMP 05/22/2022   SpO2 97%   Breastfeeding No   BMI 28.91 kg/m  Physical Exam Vitals and nursing note reviewed.  Constitutional:      General: She is not in acute distress.    Appearance: She is well-developed.  HENT:     Head: Normocephalic and atraumatic.     Right Ear: External ear normal.     Left Ear: External ear normal.     Nose: Nose normal.     Mouth/Throat:     Mouth: Mucous membranes are moist.     Pharynx: Oropharynx is clear.  Eyes:     Extraocular Movements: Extraocular movements intact.     Conjunctiva/sclera: Conjunctivae normal.     Pupils: Pupils are equal, round, and reactive to light.  Cardiovascular:     Rate and Rhythm: Normal rate and regular rhythm.     Pulses: Normal pulses.     Heart sounds: Normal heart sounds. No murmur heard. Pulmonary:     Effort: Pulmonary effort is normal. No respiratory distress.     Breath sounds: Normal breath sounds. No wheezing or rhonchi.  Abdominal:     General: Abdomen is flat. Bowel sounds are normal. There is no distension.     Palpations:  Abdomen is soft. There is no mass.     Tenderness: There is no abdominal tenderness. There is no guarding.  Musculoskeletal:        General: No swelling.     Cervical back: Neck supple.  Skin:    General: Skin is warm and dry.     Capillary Refill: Capillary refill takes less than 2 seconds.  Neurological:     Mental Status: She is alert and oriented to person, place, and time. Mental status is at baseline.  Psychiatric:        Mood and Affect: Mood normal.     ED Results / Procedures / Treatments   Labs (all labs ordered are listed, but only abnormal results are displayed) Labs Reviewed - No data to display  EKG None  Radiology No results found.  Procedures Procedures  {Document cardiac monitor, telemetry assessment procedure when appropriate:1}  Medications Ordered in  ED Medications - No data to display  ED Course/ Medical Decision Making/ A&P                           Medical Decision Making  ***  {Document critical care time when appropriate:1} {Document review of labs and clinical decision tools ie heart score, Chads2Vasc2 etc:1}  {Document your independent review of radiology images, and any outside records:1} {Document your discussion with family members, caretakers, and with consultants:1} {Document social determinants of health affecting pt's care:1} {Document your decision making why or why not admission, treatments were needed:1} Final Clinical Impression(s) / ED Diagnoses Final diagnoses:  None    Rx / DC Orders ED Discharge Orders     None

## 2022-06-04 NOTE — ED Triage Notes (Signed)
Pt states is allergic to bees and was stung by a hornet at 1300.  No oral edema noted.  Airway patent.  Pt states she feels heavy and feels a heaviness going up her arm.  Pt does not have an epi pen and did not take benadryl before coming.

## 2022-08-15 DIAGNOSIS — B9689 Other specified bacterial agents as the cause of diseases classified elsewhere: Secondary | ICD-10-CM | POA: Diagnosis not present

## 2022-08-15 DIAGNOSIS — R051 Acute cough: Secondary | ICD-10-CM | POA: Diagnosis not present

## 2022-08-15 DIAGNOSIS — J069 Acute upper respiratory infection, unspecified: Secondary | ICD-10-CM | POA: Diagnosis not present

## 2022-08-15 DIAGNOSIS — E669 Obesity, unspecified: Secondary | ICD-10-CM | POA: Diagnosis not present

## 2022-08-27 IMAGING — CT CT NECK W/ CM
3 of 4 series · 6 of 14 positions shown, 7 images · IV contrast (agent unspecified)
Comparison: None.

CLINICAL DATA: 35-year-old female with persistent sore throat.
Family history of throat cancer.

EXAM:
CT NECK WITH CONTRAST
TECHNIQUE: Multidetector CT imaging of the neck was performed using the
standard protocol following the bolus administration of intravenous
contrast.

[Series 3: neck · axial · 0.51mm/px · z∈[+831,+921]mm · 2 of 137 slices shown, 3 images]
[im 46/137  soft-tissue]
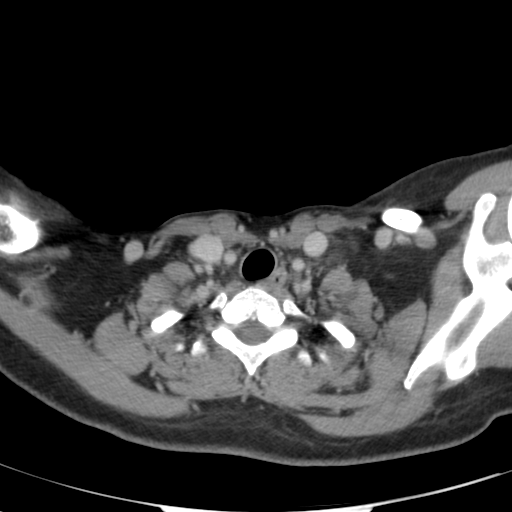
[im 46/137  bone]
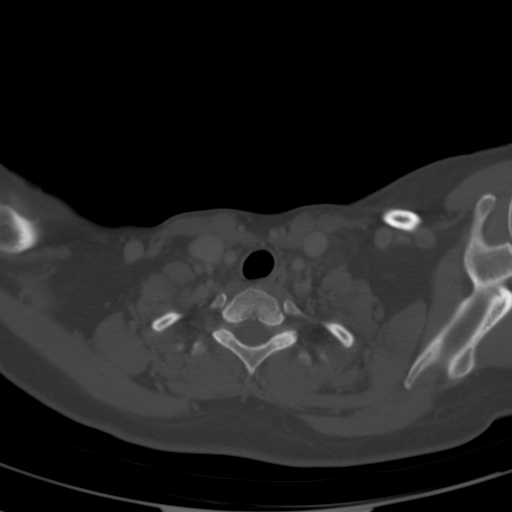
[im 91/137  bone]
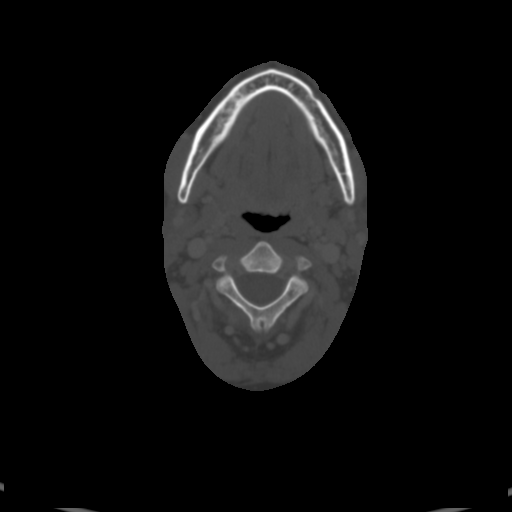

[Series 8: angled axial-oropharynx · axial · 0.39mm/px · z∈[+835,+923]mm · 2 of 134 slices shown]
[im 45/134  bone]
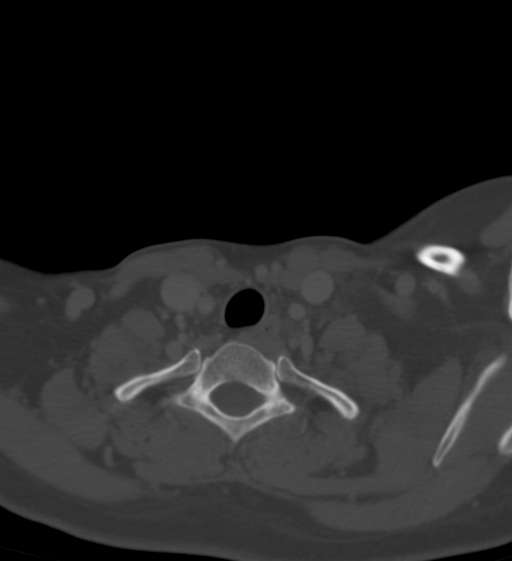
[im 89/134  bone]
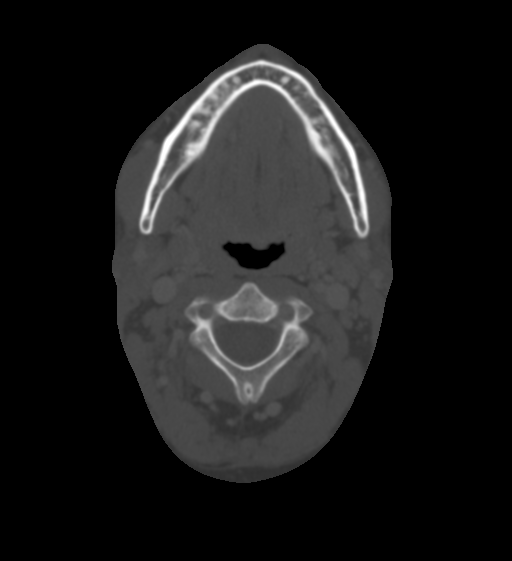

[Series 9: angled (person_name) · axial · 0.39mm/px · z∈[+835,+923]mm · 2 of 134 slices shown]
[im 45/134  bone]
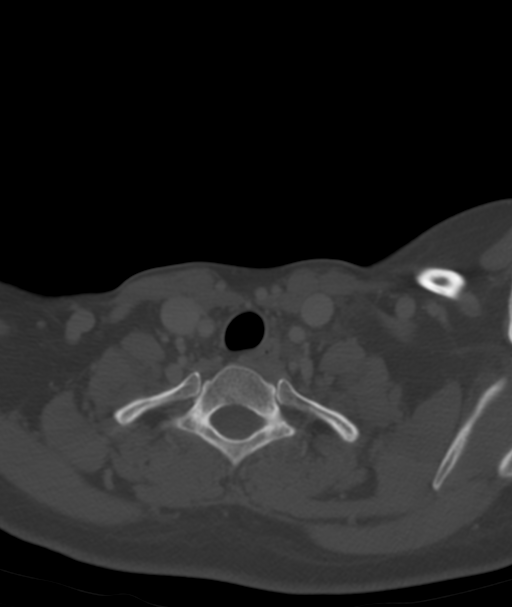
[im 89/134  bone]
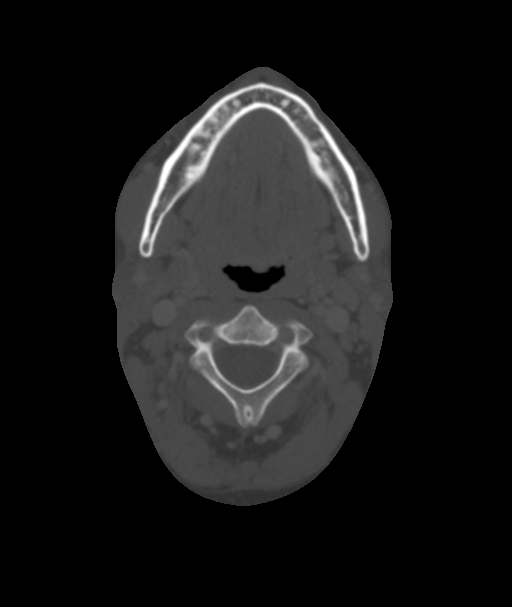

[6 of 14 positions shown; findings below may reference images not displayed]

RADIATION DOSE REDUCTION: This exam was performed according to the
departmental dose-optimization program which includes automated
exposure control, adjustment of the mA and/or kV according to
patient size and/or use of iterative reconstruction technique.

CONTRAST:  100mL P2MXFX-1RR IOPAMIDOL (P2MXFX-1RR) INJECTION 61%
FINDINGS: Pharynx and larynx: Larynx appears symmetric and normal. Mild
symmetric lingual tonsil hypertrophy. Small postinflammatory
calcifications of the left palatine tonsil. Pharyngeal soft tissue
contours and enhancement within normal limits. Negative
parapharyngeal and retropharyngeal spaces.

Salivary glands: Negative sublingual space. Submandibular glands and
parotid glands appear symmetric and normal.

Thyroid: Negative.

Lymph nodes: Negative.  No cervical lymphadenopathy.

Vascular: Major vascular structures in the neck and at the skull
base appear patent and normal.

Limited intracranial: Negative.

Visualized orbits: Leftward gaze, otherwise negative.

Mastoids and visualized paranasal sinuses: Visualized paranasal
sinuses and mastoids are clear. Tympanic cavities are clear.

Skeleton: No acute dental finding. Congenital incomplete
ossification of the posterior C1 ring, normal variant. No cervical
spine degeneration. No acute osseous abnormality identified.

Upper chest: Negative.
IMPRESSION: Negative CT appearance of the neck. No neck mass or lymphadenopathy.

## 2022-11-15 DIAGNOSIS — G4700 Insomnia, unspecified: Secondary | ICD-10-CM | POA: Diagnosis not present

## 2022-11-15 DIAGNOSIS — Z8679 Personal history of other diseases of the circulatory system: Secondary | ICD-10-CM | POA: Diagnosis not present

## 2022-11-15 DIAGNOSIS — F411 Generalized anxiety disorder: Secondary | ICD-10-CM | POA: Diagnosis not present

## 2022-11-15 DIAGNOSIS — Z131 Encounter for screening for diabetes mellitus: Secondary | ICD-10-CM | POA: Diagnosis not present

## 2022-11-15 DIAGNOSIS — R1010 Upper abdominal pain, unspecified: Secondary | ICD-10-CM | POA: Diagnosis not present

## 2022-11-15 DIAGNOSIS — E7849 Other hyperlipidemia: Secondary | ICD-10-CM | POA: Diagnosis not present

## 2022-11-15 DIAGNOSIS — F329 Major depressive disorder, single episode, unspecified: Secondary | ICD-10-CM | POA: Diagnosis not present

## 2022-11-15 DIAGNOSIS — E785 Hyperlipidemia, unspecified: Secondary | ICD-10-CM | POA: Diagnosis not present

## 2022-11-15 DIAGNOSIS — E8889 Other specified metabolic disorders: Secondary | ICD-10-CM | POA: Diagnosis not present

## 2022-11-15 DIAGNOSIS — Z79899 Other long term (current) drug therapy: Secondary | ICD-10-CM | POA: Diagnosis not present

## 2022-11-15 DIAGNOSIS — R053 Chronic cough: Secondary | ICD-10-CM | POA: Diagnosis not present

## 2022-11-15 DIAGNOSIS — E7800 Pure hypercholesterolemia, unspecified: Secondary | ICD-10-CM | POA: Diagnosis not present

## 2022-11-15 DIAGNOSIS — I10 Essential (primary) hypertension: Secondary | ICD-10-CM | POA: Diagnosis not present

## 2022-11-15 DIAGNOSIS — Z6828 Body mass index (BMI) 28.0-28.9, adult: Secondary | ICD-10-CM | POA: Diagnosis not present

## 2022-11-15 DIAGNOSIS — F419 Anxiety disorder, unspecified: Secondary | ICD-10-CM | POA: Diagnosis not present

## 2022-11-15 DIAGNOSIS — E663 Overweight: Secondary | ICD-10-CM | POA: Diagnosis not present

## 2022-11-15 DIAGNOSIS — K219 Gastro-esophageal reflux disease without esophagitis: Secondary | ICD-10-CM | POA: Diagnosis not present

## 2022-11-17 DIAGNOSIS — R110 Nausea: Secondary | ICD-10-CM | POA: Diagnosis not present

## 2022-11-17 DIAGNOSIS — K921 Melena: Secondary | ICD-10-CM | POA: Diagnosis not present

## 2022-11-17 DIAGNOSIS — R1013 Epigastric pain: Secondary | ICD-10-CM | POA: Diagnosis not present

## 2022-11-22 DIAGNOSIS — K2930 Chronic superficial gastritis without bleeding: Secondary | ICD-10-CM | POA: Diagnosis not present

## 2022-11-22 DIAGNOSIS — K2970 Gastritis, unspecified, without bleeding: Secondary | ICD-10-CM | POA: Diagnosis not present

## 2022-11-22 DIAGNOSIS — R1013 Epigastric pain: Secondary | ICD-10-CM | POA: Diagnosis not present

## 2022-11-29 DIAGNOSIS — E7849 Other hyperlipidemia: Secondary | ICD-10-CM | POA: Diagnosis not present

## 2022-11-29 DIAGNOSIS — E559 Vitamin D deficiency, unspecified: Secondary | ICD-10-CM | POA: Diagnosis not present

## 2022-11-29 DIAGNOSIS — F329 Major depressive disorder, single episode, unspecified: Secondary | ICD-10-CM | POA: Diagnosis not present

## 2022-11-29 DIAGNOSIS — F419 Anxiety disorder, unspecified: Secondary | ICD-10-CM | POA: Diagnosis not present

## 2022-11-29 DIAGNOSIS — E663 Overweight: Secondary | ICD-10-CM | POA: Diagnosis not present

## 2022-11-29 DIAGNOSIS — Z8679 Personal history of other diseases of the circulatory system: Secondary | ICD-10-CM | POA: Diagnosis not present

## 2022-11-29 DIAGNOSIS — Z6828 Body mass index (BMI) 28.0-28.9, adult: Secondary | ICD-10-CM | POA: Diagnosis not present

## 2022-11-29 DIAGNOSIS — R5382 Chronic fatigue, unspecified: Secondary | ICD-10-CM | POA: Diagnosis not present

## 2022-11-29 DIAGNOSIS — G478 Other sleep disorders: Secondary | ICD-10-CM | POA: Diagnosis not present

## 2022-12-13 DIAGNOSIS — F329 Major depressive disorder, single episode, unspecified: Secondary | ICD-10-CM | POA: Diagnosis not present

## 2022-12-13 DIAGNOSIS — E663 Overweight: Secondary | ICD-10-CM | POA: Diagnosis not present

## 2022-12-13 DIAGNOSIS — E7849 Other hyperlipidemia: Secondary | ICD-10-CM | POA: Diagnosis not present

## 2022-12-13 DIAGNOSIS — F411 Generalized anxiety disorder: Secondary | ICD-10-CM | POA: Diagnosis not present

## 2022-12-13 DIAGNOSIS — G478 Other sleep disorders: Secondary | ICD-10-CM | POA: Diagnosis not present

## 2022-12-14 DIAGNOSIS — G4719 Other hypersomnia: Secondary | ICD-10-CM | POA: Diagnosis not present

## 2022-12-14 DIAGNOSIS — F411 Generalized anxiety disorder: Secondary | ICD-10-CM | POA: Diagnosis not present

## 2022-12-27 DIAGNOSIS — R5382 Chronic fatigue, unspecified: Secondary | ICD-10-CM | POA: Diagnosis not present

## 2022-12-27 DIAGNOSIS — E7849 Other hyperlipidemia: Secondary | ICD-10-CM | POA: Diagnosis not present

## 2022-12-27 DIAGNOSIS — F411 Generalized anxiety disorder: Secondary | ICD-10-CM | POA: Diagnosis not present

## 2022-12-27 DIAGNOSIS — F329 Major depressive disorder, single episode, unspecified: Secondary | ICD-10-CM | POA: Diagnosis not present

## 2022-12-27 DIAGNOSIS — E663 Overweight: Secondary | ICD-10-CM | POA: Diagnosis not present

## 2022-12-27 DIAGNOSIS — G478 Other sleep disorders: Secondary | ICD-10-CM | POA: Diagnosis not present

## 2022-12-29 DIAGNOSIS — I10 Essential (primary) hypertension: Secondary | ICD-10-CM | POA: Diagnosis not present

## 2022-12-29 DIAGNOSIS — K219 Gastro-esophageal reflux disease without esophagitis: Secondary | ICD-10-CM | POA: Diagnosis not present

## 2022-12-29 DIAGNOSIS — E7800 Pure hypercholesterolemia, unspecified: Secondary | ICD-10-CM | POA: Diagnosis not present

## 2022-12-29 DIAGNOSIS — I471 Supraventricular tachycardia, unspecified: Secondary | ICD-10-CM | POA: Diagnosis not present

## 2023-01-12 DIAGNOSIS — F329 Major depressive disorder, single episode, unspecified: Secondary | ICD-10-CM | POA: Diagnosis not present

## 2023-01-12 DIAGNOSIS — F411 Generalized anxiety disorder: Secondary | ICD-10-CM | POA: Diagnosis not present

## 2023-01-12 DIAGNOSIS — E7849 Other hyperlipidemia: Secondary | ICD-10-CM | POA: Diagnosis not present

## 2023-01-12 DIAGNOSIS — Z8679 Personal history of other diseases of the circulatory system: Secondary | ICD-10-CM | POA: Diagnosis not present

## 2023-01-12 DIAGNOSIS — E663 Overweight: Secondary | ICD-10-CM | POA: Diagnosis not present

## 2023-01-19 ENCOUNTER — Encounter: Payer: Self-pay | Admitting: Cardiology

## 2023-01-19 ENCOUNTER — Ambulatory Visit: Payer: 59 | Admitting: Cardiology

## 2023-01-19 VITALS — BP 113/80 | HR 70 | Ht 61.0 in | Wt 145.0 lb

## 2023-01-19 DIAGNOSIS — I341 Nonrheumatic mitral (valve) prolapse: Secondary | ICD-10-CM

## 2023-01-19 DIAGNOSIS — Z8249 Family history of ischemic heart disease and other diseases of the circulatory system: Secondary | ICD-10-CM

## 2023-01-19 DIAGNOSIS — I471 Supraventricular tachycardia, unspecified: Secondary | ICD-10-CM

## 2023-01-19 DIAGNOSIS — E7800 Pure hypercholesterolemia, unspecified: Secondary | ICD-10-CM

## 2023-01-19 NOTE — Patient Instructions (Signed)
Hold rosuvastatin for 4 to 6 weeks

## 2023-01-19 NOTE — Progress Notes (Signed)
Primary Physician/Referring:  Almedia Balls, NP  Patient ID: Kelly Haney, female    DOB: [birth year], 37 y.o.   MRN: [id number]  Chief Complaint  Patient presents with   (paroxysmal supraventricular tachycardia   Follow-up   Chest Pain   HPI:    HPI: Kelly Haney  is a 37 y.o. with chronic palpitations that have been ongoing since teenage years, Has loop recorder implanted for frequent palpitations on 03/14/2018 (no has reached end-of-life) and was found to have brief episodes of PSVT, Mitral valve prolapse and mild to moderate mitral regurgitation by echocardiogram in 2019, strong family history of premature coronary artery disease and mother had myocardial infarction at age 92 (SCAD) and then developed CAD and CABG at age 78 or 80 years, died at age 34 (accidental).  This is a 2-year office visit, patient had an episode of SVT about 2 weeks ago and terminated by Valsalva.  Otherwise remains asymptomatic, has joined health and wellness and has lost 15 pounds in weight.  Remains active.  She is a mother of 4 children and wife of a Engineer, structural.  Past Medical History:  Diagnosis Date   Anxiety    Blood transfusion without reported diagnosis    Depression    zoloft   Dysrhythmia    SVT has loop recorder in place   Encounter for loop recorder check 05/01/2019   GERD (gastroesophageal reflux disease)    Hearing loss    right ear   Hemorrhoids during pregnancy    History of endometriosis    Loop in situ: Medtronic Linq for SVT and syncope 03/14/2018   Scheduled Remote loop recorder check  9.2.20:  No significant arrhythmias noted. Has h/o brief SVT. No A. Fib. Continue remote monitoring.   MVP (mitral valve prolapse)    Past Surgical History:  Procedure Laterality Date   CESAREAN SECTION     CESAREAN SECTION N/A 11/14/2018   Procedure: Repeat CESAREAN SECTION;  Surgeon: Aloha Gell, MD;  Location: [place];  Service: Obstetrics;  Laterality: N/A;  EDD: 11/19/18 Allergy:  Shellfish, Penicillin, Amoxicillin   Social History   Tobacco Use   Smoking status: Never   Smokeless tobacco: Never  Substance Use Topics   Alcohol use: Not Currently   Marital Status: Married   ROS  Review of Systems  Cardiovascular:  Negative for chest pain, dyspnea on exertion, leg swelling and palpitations.  Gastrointestinal:  Negative for melena.   Objective  Blood pressure 113/80, pulse 70, height 5\' 1"  (1.549 m), weight 145 lb (65.8 kg), SpO2 99 %. Body mass index is 27.4 kg/m.   Physical Exam Neck:     Vascular: No carotid bruit or JVD.  Cardiovascular:     Rate and Rhythm: Normal rate and regular rhythm.     Pulses: Intact distal pulses.     Heart sounds: S1 normal and S2 normal. Murmur heard.     Early systolic murmur is present with a grade of 2/6 at the upper right sternal border.     No gallop.  Pulmonary:     Effort: Pulmonary effort is normal.     Breath sounds: Normal breath sounds.  Abdominal:     General: Bowel sounds are normal.     Palpations: Abdomen is soft.  Musculoskeletal:     Right lower leg: No edema.     Left lower leg: No edema.    Radiology: No results found.  Laboratory examination:   Labs 11/16/2022:  A1c 5.6%.  TSH  normal at 1.33.  Serum glucose 90 mg, BUN 10, creatinine 0.78, EGFR 101 mL, potassium 4.2.  LFTs normal.  Total cholesterol 239, triglycerides 167, HDL 60, LDL 149.  Vitamin D 27.9.  Hb 12.6/HCT 38.1, platelets 334, normal indicis.  Cardiac Studies:   Echocardiogram 08/23/2018: Left ventricle cavity is normal in size. Normal global wall motion. Normal diastolic filling pattern. Calculated EF 55%. Mild prolapse of the mitral valve leaflets. Mild to moderate mitral regurgitation. Trace tricuspid regurgitation. Inadequate tricuspid regurgitation jet to estimate pulmonary artery pressure.  EKG:  EKG 01/19/2023: Normal sinus rhythm at the rate of 60 bpm, incomplete right bundle branch block.  Normal EKG.   Compared to 09/14/2020, no change.    Medications and allergies:   Allergies  Allergen Reactions   Bee Venom Anaphylaxis   Shellfish Allergy Anaphylaxis   Penicillins Rash    DID THE REACTION INVOLVE: Swelling of the face/tongue/throat, SOB, or low BP? No Sudden or severe rash/hives, skin peeling, or the inside of the mouth or nose? No Did it require medical treatment? No When did it last happen?      More than 2 years If all above answers are "NO", may proceed with cephalosporin use.      Current Outpatient Medications:    albuterol (VENTOLIN HFA) 108 (90 Base) MCG/ACT inhaler, Inhale 2 puffs into the lungs every 4 (four) hours as needed., Disp: , Rfl:    ALPRAZolam (XANAX) 1 MG tablet, Take 1 mg by mouth at bedtime as needed for sleep., Disp: , Rfl:    CONTRAVE 8-90 MG TB12, Take 1 tablet by mouth 2 (two) times daily., Disp: , Rfl:    EPINEPHrine 0.3 mg/0.3 mL IJ SOAJ injection, Inject 0.3 mg into the muscle as needed for anaphylaxis., Disp: 1 each, Rfl: 0   escitalopram (LEXAPRO) 20 MG tablet, Take 40 mg by mouth daily., Disp: , Rfl:    hydrOXYzine (ATARAX) 25 MG tablet, Take 25-50 mg by mouth at bedtime as needed for anxiety., Disp: , Rfl:    ibuprofen (ADVIL) 800 MG tablet, Take 1 tablet (800 mg total) by mouth 3 (three) times daily., Disp: 21 tablet, Rfl: 0   pantoprazole (PROTONIX) 40 MG tablet, Take 40 mg by mouth 2 (two) times daily., Disp: , Rfl:    rosuvastatin (CRESTOR) 10 MG tablet, Take 10 mg by mouth daily., Disp: , Rfl:    Assessment     ICD-10-CM   1. PSVT (paroxysmal supraventricular tachycardia)  I47.10 EKG 12-Lead    PCV ECHOCARDIOGRAM COMPLETE    CANCELED: PCV ECHOCARDIOGRAM COMPLETE    2. Mitral valve prolapse  I34.1 PCV ECHOCARDIOGRAM COMPLETE    CANCELED: PCV ECHOCARDIOGRAM COMPLETE    3. Mild hypercholesterolemia  E78.00     4. Family history of premature CAD  Z82.49       Recommendations:   Kelly Haney  is a 37 y.o.  with chronic palpitations  that have been ongoing since teenage years, Has loop recorder implanted for frequent palpitations on 03/14/2018 (no has reached end-of-life) and was found to have brief episodes of PSVT, Mitral valve prolapse and mild to moderate mitral regurgitation by echocardiogram in 2019, strong family history of premature coronary artery disease and mother had myocardial infarction at age 54 (SCAD) and then developed CAD and CABG at age 25 or 14 years, died at age 5 (accidental).  1. PSVT (paroxysmal supraventricular tachycardia) Patient had another episode of PSVT about a week to 10 days ago that lasted less than a  minute however was fairly intense but terminated with Valsalva maneuver.  Will continue observation for now and unless there is a change in the frequency or intensity, continued medical therapy for now.  I had seen the patient >2 years ago.  - EKG 12-Lead - PCV ECHOCARDIOGRAM COMPLETE; Future  2. Mitral valve prolapse She has mitral valve prolapse and she had mild to moderate MR previously when I last seen her by echocardiogram in 2019.  She now has a very prominent aortic systolic murmur as well, will repeat echocardiogram to evaluate MR and to look for any other pathology. - PCV ECHOCARDIOGRAM COMPLETE; Future  3. Mild hypercholesterolemia Patient is now enrolled in Wellness clinic with Dr. Roderic Palau, Jolaine Artist.  After recent labs, as patient has completed her pregnancy and she is not planning on having anymore children, yet started her on statin and wanted my opinion.  In view of her very strong family history of potential coronary disease, it was the best thing to do.  However patient has noticed "brain fogginess and forgetfulness" recently.  I have advised her to have a statin holiday for 6 weeks and she will contact me whether symptoms are improved.  If symptoms are not improved, she may need a neurologic evaluation otherwise we may have to change her statin therapy.  4. Family history of premature  CAD Patient has a very strong family history of premature coronary disease especially in her mother.  I reviewed her external labs, as patient is willing to be on a statin, no need or indication for obtaining coronary calcium score.  She continues to remain active and exercises regularly as well, no indication for any stress testing as well for now.  I would like to see her back annually.    Adrian Prows, MD, [facility] 01/19/2023, 12:21 PM Office: [phone] Pager: [phone]

## 2023-01-22 DIAGNOSIS — F411 Generalized anxiety disorder: Secondary | ICD-10-CM | POA: Diagnosis not present

## 2023-01-25 DIAGNOSIS — F329 Major depressive disorder, single episode, unspecified: Secondary | ICD-10-CM | POA: Diagnosis not present

## 2023-01-25 DIAGNOSIS — E7849 Other hyperlipidemia: Secondary | ICD-10-CM | POA: Diagnosis not present

## 2023-01-25 DIAGNOSIS — E663 Overweight: Secondary | ICD-10-CM | POA: Diagnosis not present

## 2023-01-25 DIAGNOSIS — F411 Generalized anxiety disorder: Secondary | ICD-10-CM | POA: Diagnosis not present

## 2023-01-25 DIAGNOSIS — Z8679 Personal history of other diseases of the circulatory system: Secondary | ICD-10-CM | POA: Diagnosis not present

## 2023-02-08 DIAGNOSIS — E663 Overweight: Secondary | ICD-10-CM | POA: Diagnosis not present

## 2023-02-08 DIAGNOSIS — F329 Major depressive disorder, single episode, unspecified: Secondary | ICD-10-CM | POA: Diagnosis not present

## 2023-02-08 DIAGNOSIS — Z8679 Personal history of other diseases of the circulatory system: Secondary | ICD-10-CM | POA: Diagnosis not present

## 2023-02-08 DIAGNOSIS — F411 Generalized anxiety disorder: Secondary | ICD-10-CM | POA: Diagnosis not present

## 2023-02-08 DIAGNOSIS — Z6825 Body mass index (BMI) 25.0-25.9, adult: Secondary | ICD-10-CM | POA: Diagnosis not present

## 2023-02-08 DIAGNOSIS — E7849 Other hyperlipidemia: Secondary | ICD-10-CM | POA: Diagnosis not present

## 2023-02-13 ENCOUNTER — Ambulatory Visit: Payer: 59

## 2023-02-13 DIAGNOSIS — I471 Supraventricular tachycardia, unspecified: Secondary | ICD-10-CM

## 2023-02-13 DIAGNOSIS — I341 Nonrheumatic mitral (valve) prolapse: Secondary | ICD-10-CM

## 2023-03-08 DIAGNOSIS — Z79899 Other long term (current) drug therapy: Secondary | ICD-10-CM | POA: Diagnosis not present

## 2023-03-08 DIAGNOSIS — F411 Generalized anxiety disorder: Secondary | ICD-10-CM | POA: Diagnosis not present

## 2023-03-08 DIAGNOSIS — Z6824 Body mass index (BMI) 24.0-24.9, adult: Secondary | ICD-10-CM | POA: Diagnosis not present

## 2023-03-08 DIAGNOSIS — E663 Overweight: Secondary | ICD-10-CM | POA: Diagnosis not present

## 2023-03-08 DIAGNOSIS — Z8679 Personal history of other diseases of the circulatory system: Secondary | ICD-10-CM | POA: Diagnosis not present

## 2023-03-08 DIAGNOSIS — I10 Essential (primary) hypertension: Secondary | ICD-10-CM | POA: Diagnosis not present

## 2023-03-08 DIAGNOSIS — E7849 Other hyperlipidemia: Secondary | ICD-10-CM | POA: Diagnosis not present

## 2023-03-08 DIAGNOSIS — Z6825 Body mass index (BMI) 25.0-25.9, adult: Secondary | ICD-10-CM | POA: Diagnosis not present

## 2023-03-08 DIAGNOSIS — E785 Hyperlipidemia, unspecified: Secondary | ICD-10-CM | POA: Diagnosis not present

## 2023-03-08 DIAGNOSIS — E7800 Pure hypercholesterolemia, unspecified: Secondary | ICD-10-CM | POA: Diagnosis not present

## 2023-03-08 DIAGNOSIS — F329 Major depressive disorder, single episode, unspecified: Secondary | ICD-10-CM | POA: Diagnosis not present

## 2023-03-25 ENCOUNTER — Encounter (HOSPITAL_BASED_OUTPATIENT_CLINIC_OR_DEPARTMENT_OTHER): Payer: Self-pay | Admitting: Emergency Medicine

## 2023-03-25 ENCOUNTER — Emergency Department (HOSPITAL_BASED_OUTPATIENT_CLINIC_OR_DEPARTMENT_OTHER)
Admission: EM | Admit: 2023-03-25 | Discharge: 2023-03-25 | Disposition: A | Discharge status: Home / Self Care | Payer: 59 | Attending: Emergency Medicine | Admitting: Emergency Medicine

## 2023-03-25 ENCOUNTER — Other Ambulatory Visit: Payer: Self-pay

## 2023-03-25 ENCOUNTER — Emergency Department (HOSPITAL_BASED_OUTPATIENT_CLINIC_OR_DEPARTMENT_OTHER): Payer: 59

## 2023-03-25 DIAGNOSIS — S91111A Laceration without foreign body of right great toe without damage to nail, initial encounter: Secondary | ICD-10-CM | POA: Insufficient documentation

## 2023-03-25 DIAGNOSIS — F17200 Nicotine dependence, unspecified, uncomplicated: Secondary | ICD-10-CM | POA: Diagnosis not present

## 2023-03-25 DIAGNOSIS — R200 Anesthesia of skin: Secondary | ICD-10-CM | POA: Diagnosis not present

## 2023-03-25 DIAGNOSIS — S99921A Unspecified injury of right foot, initial encounter: Secondary | ICD-10-CM | POA: Diagnosis present

## 2023-03-25 DIAGNOSIS — W293XXA Contact with powered garden and outdoor hand tools and machinery, initial encounter: Secondary | ICD-10-CM | POA: Diagnosis not present

## 2023-03-25 MED ORDER — LIDOCAINE HCL (PF) 1 % IJ SOLN
30.0000 mL | Freq: Once | INTRAMUSCULAR | Status: AC
  Administered 2023-03-25: 30 mL
  Filled 2023-03-25: qty 30

## 2023-03-25 MED ORDER — CEPHALEXIN 500 MG PO CAPS: 500.0000 mg | ORAL_CAPSULE | Freq: Four times a day (QID) | ORAL | 0 refills | Status: DC

## 2023-03-25 MED ORDER — CEPHALEXIN 250 MG PO CAPS
500.0000 mg | ORAL_CAPSULE | Freq: Once | ORAL | Status: AC
  Administered 2023-03-25: 500 mg via ORAL
  Filled 2023-03-25: qty 2

## 2023-03-25 NOTE — ED Provider Notes (Signed)
[place] EMERGENCY DEPARTMENT AT [facility] Provider Note   CSN: [id number] Arrival date & time: 03/25/23  1712     History  Chief Complaint  Patient presents with   Toe Injury    Cohen Legarda is a 37 y.o. female.  HPI    37 year old female comes in with chief complaint of toe injury.  Patient has no significant past medical history.  She states that she accidentally cut her right great toe on a chainsaw.  She did not have her feet covered when the accident occurred.  Effectively the chainsaw bounced onto her toe and then bounced back.  She had severe bleeding on site.  She is concerned that the wound is deep and might have affected her bone.  Patient is complaining of numbness over the distal part of the great right toe, but also mentions that there is numbness in all of the toes and the pain sometimes will shoot up towards her hip.  She is up-to-date with tetanus.  Home Medications Prior to Admission medications   Medication Sig Start Date End Date Taking? Authorizing Provider  cephALEXin (KEFLEX) 500 MG capsule Take 1 capsule (500 mg total) by mouth 4 (four) times daily. 03/25/23  Yes Shayda Kalka, MD  albuterol (VENTOLIN HFA) 108 (90 Base) MCG/ACT inhaler Inhale 2 puffs into the lungs every 4 (four) hours as needed. 01/12/23   [provider]  ALPRAZolam Prudy Feeler) 1 MG tablet Take 1 mg by mouth at bedtime as needed for sleep. 02/10/22   [provider]  CONTRAVE 8-90 MG TB12 Take 1 tablet by mouth 2 (two) times daily. 01/16/23   [provider]  EPINEPHrine 0.3 mg/0.3 mL IJ SOAJ injection Inject 0.3 mg into the muscle as needed for anaphylaxis. 06/04/22   Rondel Baton, MD  escitalopram (LEXAPRO) 20 MG tablet Take 40 mg by mouth daily. 01/12/23   [provider]  hydrOXYzine (ATARAX) 25 MG tablet Take 25-50 mg by mouth at bedtime as needed for anxiety. 05/24/22   [provider]  ibuprofen (ADVIL) 800 MG tablet Take 1 tablet  (800 mg total) by mouth 3 (three) times daily. 02/20/21   Wieters, Hallie C, PA-C  pantoprazole (PROTONIX) 40 MG tablet Take 40 mg by mouth 2 (two) times daily. 12/29/22   [provider]  rosuvastatin (CRESTOR) 10 MG tablet Take 10 mg by mouth daily. 01/10/23   [provider]      Allergies    Bee venom, Shellfish allergy, and Penicillins    Review of Systems   Review of Systems  All other systems reviewed and are negative.   Physical Exam Updated Vital Signs BP 128/79 (BP Location: Right Arm)   Pulse 75   Temp 98.1 F (36.7 C) (Oral)   Resp 18   Ht 5\' 1"  (1.549 m)   Wt 57.2 kg   SpO2 100%   BMI 23.81 kg/m  Physical Exam Vitals and nursing note reviewed.  Constitutional:      Appearance: She is well-developed.  HENT:     Head: Normocephalic and atraumatic.  Eyes:     Extraocular Movements: Extraocular movements intact.  Cardiovascular:     Rate and Rhythm: Normal rate.  Pulmonary:     Effort: Pulmonary effort is normal.  Musculoskeletal:     Cervical back: Normal range of motion and neck supple.  Skin:    General: Skin is dry.     Comments: Patient has a superficial laceration over the right great toe.  Neurological:     Mental Status: She is alert and oriented to person, place, and time.     Comments: Patient has subjective numbness over the right toe both over the plantar and dorsal aspect compared to the left side.  Able to plantar and dorsiflex the toe.     ED Results / Procedures / Treatments   Labs (all labs ordered are listed, but only abnormal results are displayed) Labs Reviewed - No data to display  EKG None  Radiology DG Toe Great Right  Result Date: 03/25/2023 CLINICAL DATA:  Chainsaw injury. EXAM: RIGHT GREAT TOE COMPARISON:  None Available. FINDINGS: There is no evidence of fracture or dislocation. There is no evidence of arthropathy or other focal bone abnormality. Soft tissues are unremarkable. IMPRESSION: Negative.  Electronically Signed   By: Gerome Sam III M.D.   On: 03/25/2023 17:32    Procedures Procedures    Medications Ordered in ED Medications  cephALEXin (KEFLEX) capsule 500 mg (has no administration in time range)  lidocaine (PF) (XYLOCAINE) 1 % injection 30 mL (30 mLs Infiltration Given 03/25/23 1807)    ED Course/ Medical Decision Making/ A&P                             Medical Decision Making Amount and/or Complexity of Data Reviewed Radiology: ordered.  Risk Prescription drug management.   37 year old female comes in with chief complaint of laceration to the right great toe from a chainsaw.  She is up-to-date with her tetanus and otherwise healthy.  Differential diagnosis considered for this patient includes superficial laceration, neuropraxia because of traumatic nerve injury, tendon injury to the great toe, fracture of the toe.  X-ray of the toe ordered and independently interpreted.  There is no evidence of fracture.  No evidence of foreign body.  The wound appears superficial, but she is complaining of numbness distal to the injury and sometimes the pain radiating up to the hip from her toe.  She has history of sciatica, but thinks that this pain is different and more importantly started after the injury.  She also has numbness over all of her toes.  She is a smoker.  Plan is to numb her toe and do a thorough washout and evaluation to see if there is soft tissue injury.  If the wound is indeed superficial then we will put sutures in and have patient follow-up with her PCP.  Final Clinical Impression(s) / ED Diagnoses Final diagnoses:  Laceration of right great toe without foreign body present or damage to nail, initial encounter    Rx / DC Orders ED Discharge Orders          Ordered    cephALEXin (KEFLEX) 500 MG capsule  4 times daily        03/25/23 1912              Derwood Kaplan, MD 03/25/23 1937

## 2023-03-25 NOTE — ED Triage Notes (Signed)
Pt arrives to ED with c/o right great toe injury after cutting it on a chainsaw.

## 2023-03-25 NOTE — ED Provider Notes (Addendum)
..  Laceration Repair  Date/Time: 03/25/2023 6:49 PM  Performed by: Gailen Shelter, PA Authorized by: Gailen Shelter, PA   Consent:    Consent obtained:  Verbal   Consent given by:  Parent and patient   Risks discussed:  Infection, pain, poor cosmetic result and poor wound healing Universal protocol:    Procedure explained and questions answered to patient or proxy's satisfaction: yes     Relevant documents present and verified: yes     Test results available: yes     Imaging studies available: yes     Required blood products, implants, devices, and special equipment available: yes     Site/side marked: yes     Immediately prior to procedure, a time out was called: yes     Patient identity confirmed:  Verbally with patient and arm band Laceration details:    Length (cm):  1.5 Exploration:    Imaging obtained: x-ray     Imaging outcome: foreign body not noted     Wound extent: no foreign body and no tendon damage     Contaminated: no   Treatment:    Amount of cleaning:  Standard   Irrigation solution:  Sterile saline   Irrigation volume:  100 cc   Irrigation method:  Syringe   Visualized foreign bodies/material removed: no   Skin repair:    Repair method:  Sutures   Suture size:  5-0   Suture material:  Fast-absorbing gut   Suture technique:  Simple interrupted   Number of sutures:  2 Approximation:    Approximation:  Close Repair type:    Repair type:  Intermediate Post-procedure details:    Dressing:  Antibiotic ointment and adhesive bandage   Procedure completion:  Tolerated .Nerve Block  Date/Time: 03/25/2023 7:12 PM  Performed by: Gailen Shelter, PA Authorized by: Gailen Shelter, PA   Consent:    Consent obtained:  Verbal   Consent given by:  Patient   Risks discussed:  Unsuccessful block, nerve damage, infection, intravenous injection and pain   Alternatives discussed:  No treatment, delayed treatment and referral Indications:    Indications:  Pain  relief and procedural anesthesia Location:    Body area:  Upper extremity Pre-procedure details:    Skin preparation:  Alcohol Procedure details:    Block needle gauge:  25 G   Anesthetic injected:  Lidocaine 1% w/o epi   Steroid injected:  None   Additive injected:  None   Injection procedure:  Anatomic landmarks identified, incremental injection, negative aspiration for blood and anatomic landmarks palpated Post-procedure details:    Dressing:  Sterile dressing   Outcome:  Anesthesia achieved   Procedure completion:  Tolerated well, no immediate complications Comments:     Right great toe with successful nerve block with 2 point injection method     Gailen Shelter, PA 03/25/23 1912    Gailen Shelter, [place] 03/25/23 1913    Derwood Kaplan, MD 03/25/23 1934

## 2023-03-25 NOTE — Discharge Instructions (Addendum)
The sutures I have placed will absorb/dissolve on their own.  The small amount of not/tied suture material on the surface will fall off.  Wash with warm soapy water twice daily and dab dry and cover with a thin layer bacitracin ointment that I provided you.

## 2023-04-05 DIAGNOSIS — Z8679 Personal history of other diseases of the circulatory system: Secondary | ICD-10-CM | POA: Diagnosis not present

## 2023-04-05 DIAGNOSIS — E663 Overweight: Secondary | ICD-10-CM | POA: Diagnosis not present

## 2023-04-05 DIAGNOSIS — F329 Major depressive disorder, single episode, unspecified: Secondary | ICD-10-CM | POA: Diagnosis not present

## 2023-04-05 DIAGNOSIS — Z6823 Body mass index (BMI) 23.0-23.9, adult: Secondary | ICD-10-CM | POA: Diagnosis not present

## 2023-04-05 DIAGNOSIS — F411 Generalized anxiety disorder: Secondary | ICD-10-CM | POA: Diagnosis not present

## 2023-04-05 DIAGNOSIS — E7849 Other hyperlipidemia: Secondary | ICD-10-CM | POA: Diagnosis not present

## 2023-04-05 DIAGNOSIS — Z8639 Personal history of other endocrine, nutritional and metabolic disease: Secondary | ICD-10-CM | POA: Diagnosis not present

## 2023-04-05 DIAGNOSIS — E559 Vitamin D deficiency, unspecified: Secondary | ICD-10-CM | POA: Diagnosis not present

## 2023-04-28 ENCOUNTER — Telehealth: Payer: Self-pay

## 2023-04-28 NOTE — Telephone Encounter (Signed)
Patient stated that her apple watch alerted her that her heart rate dropped down to 8:14 HR 47 and 8:27 49, while she was sleeping,  and she wants to know if this is something she should be concerned with? Patient also stated that she was recently prescribed Trazodone last Friday and is not sure if that could be the cause. Please advise.   Patient has Mychart.

## 2023-04-30 ENCOUNTER — Encounter: Payer: Self-pay | Admitting: Cardiology

## 2023-05-03 DIAGNOSIS — K219 Gastro-esophageal reflux disease without esophagitis: Secondary | ICD-10-CM | POA: Diagnosis not present

## 2023-05-03 DIAGNOSIS — E7849 Other hyperlipidemia: Secondary | ICD-10-CM | POA: Diagnosis not present

## 2023-05-03 DIAGNOSIS — E559 Vitamin D deficiency, unspecified: Secondary | ICD-10-CM | POA: Diagnosis not present

## 2023-05-03 DIAGNOSIS — Z8679 Personal history of other diseases of the circulatory system: Secondary | ICD-10-CM | POA: Diagnosis not present

## 2023-05-03 DIAGNOSIS — E663 Overweight: Secondary | ICD-10-CM | POA: Diagnosis not present

## 2023-05-03 DIAGNOSIS — F329 Major depressive disorder, single episode, unspecified: Secondary | ICD-10-CM | POA: Diagnosis not present

## 2023-05-03 DIAGNOSIS — Z8639 Personal history of other endocrine, nutritional and metabolic disease: Secondary | ICD-10-CM | POA: Diagnosis not present

## 2023-05-03 DIAGNOSIS — F411 Generalized anxiety disorder: Secondary | ICD-10-CM | POA: Diagnosis not present

## 2023-05-03 DIAGNOSIS — Z6823 Body mass index (BMI) 23.0-23.9, adult: Secondary | ICD-10-CM | POA: Diagnosis not present

## 2023-05-23 ENCOUNTER — Ambulatory Visit: Payer: 59 | Admitting: Cardiology

## 2023-05-23 ENCOUNTER — Encounter: Payer: Self-pay | Admitting: Cardiology

## 2023-05-23 VITALS — BP 116/77 | HR 64 | Resp 16 | Ht 61.0 in | Wt 118.2 lb

## 2023-05-23 DIAGNOSIS — R55 Syncope and collapse: Secondary | ICD-10-CM

## 2023-05-23 DIAGNOSIS — I471 Supraventricular tachycardia, unspecified: Secondary | ICD-10-CM

## 2023-05-23 MED ORDER — PROPRANOLOL HCL ER 60 MG PO CP24: 60.0000 mg | ORAL_CAPSULE | ORAL | 2 refills | Status: DC

## 2023-05-23 NOTE — Progress Notes (Signed)
Primary Physician/Referring:  Carilyn Goodpasture, NP  Patient ID: Kelly Haney, female    DOB: [birth year], 37 y.o.   MRN: [id number]  Chief Complaint  Patient presents with   PSVT (paroxysmal supraventricular tachycardia)   Follow-up   HPI:    HPI: Kelly Haney  is a 37 y.o. with chronic palpitations that have been ongoing since teenage years, oop recorder implanted for frequent palpitations on 03/14/2018 (no has reached end-of-life) and was found to have brief episodes of PSVT, Mitral valve prolapse and mild mitral regurgitation by echocardiogram in 2019, strong family history of premature coronary artery disease and mother had myocardial infarction at age 33 (SCAD) and then developed CAD and CABG at age 74 or 40 years, died at age 59 (accidental).  She now presents for evaluation of syncope and worsening palpitations.  She has lost about 35 pounds in weight with weight loss clinic, she made an appointment to see me due to recurrent episodes of palpitations, anxiety, low heart rate at night and also syncope.  Patient states that she is on a civil lawsuit, and hence has been extremely anxious facing the quadrant and wires.  Since then she has noticed frequent episodes of palpitations lasting briefly and episodes of sudden onset syncope preceding palpitations.  No injury.  She is wondering if she should have SVT ablation.  She is a mother of 4 children and wife of a Emergency planning/management officer.  Past Medical History:  Diagnosis Date   Anxiety    Blood transfusion without reported diagnosis    Depression    zoloft   Dysrhythmia    SVT has loop recorder in place   Encounter for loop recorder check 05/01/2019   GERD (gastroesophageal reflux disease)    Hearing loss    right ear   Hemorrhoids during pregnancy    History of endometriosis    Loop in situ: Medtronic Linq for SVT and syncope 03/14/2018   Scheduled Remote loop recorder check  9.2.20:  No significant arrhythmias noted. Has h/o brief SVT. No A.  Fib. Continue remote monitoring.   MVP (mitral valve prolapse)    Past Surgical History:  Procedure Laterality Date   CESAREAN SECTION     CESAREAN SECTION N/A 11/14/2018   Procedure: Repeat CESAREAN SECTION;  Surgeon: Noland Fordyce, MD;  Location: [facility] BIRTHING SUITES;  Service: Obstetrics;  Laterality: N/A;  EDD: 11/19/18 Allergy: Shellfish, Penicillin, Amoxicillin   Social History   Tobacco Use   Smoking status: Never   Smokeless tobacco: Never  Substance Use Topics   Alcohol use: Never   Marital Status: Married   ROS  Review of Systems  Cardiovascular:  Negative for chest pain, dyspnea on exertion, leg swelling and palpitations.  Gastrointestinal:  Negative for melena.   Objective  Blood pressure 116/77, pulse 64, resp. rate 16, height 5\' 1"  (1.549 m), weight 118 lb 3.2 oz (53.6 kg), SpO2 98%. Body mass index is 22.33 kg/m.   Physical Exam Neck:     Vascular: No carotid bruit or JVD.  Cardiovascular:     Rate and Rhythm: Normal rate and regular rhythm.     Pulses: Intact distal pulses.     Heart sounds: S1 normal and S2 normal. Murmur heard.     Early systolic murmur is present with a grade of 2/6 at the upper right sternal border.     No gallop.  Pulmonary:     Effort: Pulmonary effort is normal.     Breath sounds: Normal breath sounds.  Abdominal:     General: Bowel sounds are normal.     Palpations: Abdomen is soft.  Musculoskeletal:     Right lower leg: No edema.     Left lower leg: No edema.    Radiology: No results found.  Laboratory examination:   Labs 11/16/2022:  A1c 5.6%.  TSH normal at 1.33.  Serum glucose 90 mg, BUN 10, creatinine 0.78, EGFR 101 mL, potassium 4.2.  LFTs normal.  Total cholesterol 239, triglycerides 167, HDL 60, LDL 149.  Vitamin D 27.9.  Hb 12.6/HCT 38.1, platelets 334, normal indicis.  Cardiac Studies:   Echocardiogram 02/13/2023: Normal LV systolic function with visual EF 60-65%. Left ventricle cavity is normal in  size. Normal left ventricular wall thickness. Normal global wall motion. Normal diastolic filling pattern, normal LAP. Calculated EF 61%. Mild (Grade I) mitral regurgitation. Mild prolapse of the mitral valve leaflets. Structurally normal tricuspid valve.  Mild tricuspid regurgitation. No evidence of pulmonary hypertension. IVC is dilated with respiratory variation. No significant change compared to 07/2018.     EKG:  EKG 01/19/2023: Normal sinus rhythm at the rate of 60 bpm, incomplete right bundle branch block.  Normal EKG.  Compared to 09/14/2020, no change.    Medications and allergies:   Allergies  Allergen Reactions   Bee Venom Anaphylaxis   Shellfish Allergy Anaphylaxis   Penicillins Rash    DID THE REACTION INVOLVE: Swelling of the face/tongue/throat, SOB, or low BP? No Sudden or severe rash/hives, skin peeling, or the inside of the mouth or nose? No Did it require medical treatment? No When did it last happen?      More than 2 years If all above answers are "NO", may proceed with cephalosporin use.      Current Outpatient Medications:    albuterol (VENTOLIN HFA) 108 (90 Base) MCG/ACT inhaler, Inhale 2 puffs into the lungs every 4 (four) hours as needed., Disp: , Rfl:    CONTRAVE 8-90 MG TB12, Take 1 tablet by mouth 2 (two) times daily., Disp: , Rfl:    EPINEPHrine 0.3 mg/0.3 mL IJ SOAJ injection, Inject 0.3 mg into the muscle as needed for anaphylaxis., Disp: 1 each, Rfl: 0   escitalopram (LEXAPRO) 20 MG tablet, Take 40 mg by mouth daily., Disp: , Rfl:    ibuprofen (ADVIL) 800 MG tablet, Take 1 tablet (800 mg total) by mouth 3 (three) times daily., Disp: 21 tablet, Rfl: 0   pantoprazole (PROTONIX) 40 MG tablet, Take 40 mg by mouth 2 (two) times daily., Disp: , Rfl:    propranolol ER (INDERAL LA) 60 MG 24 hr capsule, Take 1 capsule (60 mg total) by mouth as directed. Daily. Can repeat 1 more dose for palpitation or anxiety, Disp: 45 capsule, Rfl: 2   Assessment      ICD-10-CM   1. PSVT (paroxysmal supraventricular tachycardia)  I47.10 propranolol ER (INDERAL LA) 60 MG 24 hr capsule    2. Vasovagal syncope  R55 propranolol ER (INDERAL LA) 60 MG 24 hr capsule      Recommendations:   Mayanna Hetz  is a 37 y.o.  with chronic palpitations that have been ongoing since teenage years, oop recorder implanted for frequent palpitations on 03/14/2018 (no has reached end-of-life) and was found to have brief episodes of PSVT, Mitral valve prolapse and mild mitral regurgitation by echocardiogram in 2019, strong family history of premature coronary artery disease and mother had myocardial infarction at age 15 (SCAD) and then developed CAD and CABG at age 76 or  45 years, died at age 10 (accidental).  She now presents for evaluation of syncope and worsening palpitations.  1. PSVT (paroxysmal supraventricular tachycardia) Patient under extreme stress, is going through a single trial, hence has noticed episodes of near syncope, worsening palpitations.  Her symptoms suggest probably sinus tachycardia versus brief PSVT, she has not had any sustained episodes.  Although she was willing to go for ablation, I suggested that we wait and watch for now as she is under extreme stress.  I would like to start her on propranolol LA 60 mg daily, she can certainly take 1 extra pill if she has more anxiety and/or palpitations.  Good hydration with Pedialyte also discussed.  Patient's husband is present.  - propranolol ER (INDERAL LA) 60 MG 24 hr capsule; Take 1 capsule (60 mg total) by mouth as directed. Daily. Can repeat 1 more dose for palpitation or anxiety  Dispense: 45 capsule; Refill: 2  2. Vasovagal syncope Patient has had several episodes of near syncope or syncope, but no injury.  She is presently having episodes of palpitations prior to syncope, suspect significant weight loss, low blood pressure, vasovagal episodes related to severe anxiety is contributing to this.  Hopefully  addition of a nonselective beta-blocker like propranolol will help with this.  I would like to see him back in 3 months for follow-up.  She is aware to contact me if she has worsening symptoms.  Patient would like to have her loop recorder explanted as she has lost significant amount of weight and it is now jetting out from her skin and hurts.  I will schedule this.    Yates Decamp, MD, [facility] 05/23/2023, 9:59 AM Office: [phone] Pager: [phone]

## 2023-06-05 DIAGNOSIS — F329 Major depressive disorder, single episode, unspecified: Secondary | ICD-10-CM | POA: Diagnosis not present

## 2023-06-05 DIAGNOSIS — Z8639 Personal history of other endocrine, nutritional and metabolic disease: Secondary | ICD-10-CM | POA: Diagnosis not present

## 2023-06-05 DIAGNOSIS — Z6823 Body mass index (BMI) 23.0-23.9, adult: Secondary | ICD-10-CM | POA: Diagnosis not present

## 2023-06-05 DIAGNOSIS — Z8679 Personal history of other diseases of the circulatory system: Secondary | ICD-10-CM | POA: Diagnosis not present

## 2023-06-05 DIAGNOSIS — E559 Vitamin D deficiency, unspecified: Secondary | ICD-10-CM | POA: Diagnosis not present

## 2023-06-05 DIAGNOSIS — E663 Overweight: Secondary | ICD-10-CM | POA: Diagnosis not present

## 2023-06-05 DIAGNOSIS — E7849 Other hyperlipidemia: Secondary | ICD-10-CM | POA: Diagnosis not present

## 2023-06-05 DIAGNOSIS — K219 Gastro-esophageal reflux disease without esophagitis: Secondary | ICD-10-CM | POA: Diagnosis not present

## 2023-06-05 DIAGNOSIS — F411 Generalized anxiety disorder: Secondary | ICD-10-CM | POA: Diagnosis not present

## 2023-06-24 DIAGNOSIS — F411 Generalized anxiety disorder: Secondary | ICD-10-CM | POA: Diagnosis not present

## 2023-06-30 ENCOUNTER — Encounter: Payer: Self-pay | Admitting: Cardiology

## 2023-06-30 ENCOUNTER — Ambulatory Visit: Payer: 59 | Admitting: Cardiology

## 2023-06-30 DIAGNOSIS — Z4589 Encounter for adjustment and management of other implanted devices: Secondary | ICD-10-CM | POA: Diagnosis not present

## 2023-06-30 DIAGNOSIS — I471 Supraventricular tachycardia, unspecified: Secondary | ICD-10-CM

## 2023-06-30 DIAGNOSIS — Z95818 Presence of other cardiac implants and grafts: Secondary | ICD-10-CM | POA: Diagnosis not present

## 2023-06-30 NOTE — Progress Notes (Signed)
.   Chief Complaint  Patient presents with   Loop recorder    Explantation     Procedure technique:  Under sterile precautions, using 1% lidocaine for local anesthesia, I made a small nick on the superior aspect of the easily palpable loop recorder, I then utilized a hemostat to easily grasp the device and the device was explanted without any complications.  There was no blood loss.  The wound was closed with Dermabond without any complications.  Patient instructions were given, advised her to keep the wound dry for the next 2 to 3 days until it is completely dry and healed.  To contact us if there is any complication or bleeding.    ICD-10-CM   1. Loop in situ: Medtronic Linq for SVT and syncope  Z95.818     2. PSVT (paroxysmal supraventricular tachycardia)  I47.10

## 2023-07-05 DIAGNOSIS — E559 Vitamin D deficiency, unspecified: Secondary | ICD-10-CM | POA: Diagnosis not present

## 2023-07-05 DIAGNOSIS — E7849 Other hyperlipidemia: Secondary | ICD-10-CM | POA: Diagnosis not present

## 2023-07-05 DIAGNOSIS — Z8679 Personal history of other diseases of the circulatory system: Secondary | ICD-10-CM | POA: Diagnosis not present

## 2023-07-05 DIAGNOSIS — F329 Major depressive disorder, single episode, unspecified: Secondary | ICD-10-CM | POA: Diagnosis not present

## 2023-07-05 DIAGNOSIS — E663 Overweight: Secondary | ICD-10-CM | POA: Diagnosis not present

## 2023-07-05 DIAGNOSIS — Z6823 Body mass index (BMI) 23.0-23.9, adult: Secondary | ICD-10-CM | POA: Diagnosis not present

## 2023-07-05 DIAGNOSIS — Z8639 Personal history of other endocrine, nutritional and metabolic disease: Secondary | ICD-10-CM | POA: Diagnosis not present

## 2023-07-05 DIAGNOSIS — F411 Generalized anxiety disorder: Secondary | ICD-10-CM | POA: Diagnosis not present

## 2023-07-05 DIAGNOSIS — K219 Gastro-esophageal reflux disease without esophagitis: Secondary | ICD-10-CM | POA: Diagnosis not present

## 2023-07-26 ENCOUNTER — Ambulatory Visit (HOSPITAL_COMMUNITY)
Admission: EM | Admit: 2023-07-26 | Discharge: 2023-07-26 | Disposition: A | Discharge status: Home / Self Care | Payer: 59

## 2023-07-26 DIAGNOSIS — F411 Generalized anxiety disorder: Secondary | ICD-10-CM

## 2023-07-26 NOTE — Discharge Instructions (Addendum)
Patient recommended to call [phone] or [phone] if anxiety or depressive symptoms increase. Patient instructed to return to [facility] if symptoms worsen at any time, as the facility is open 24 hours daily.

## 2023-07-26 NOTE — ED Notes (Signed)
Pt and her husband was given gold fish and gram cracker.
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
## 2023-07-26 NOTE — ED Notes (Signed)
Pt discharged with  AVS.  AVS reviewed prior to discharge.  Pt alert, oriented, and ambulatory.  Safety maintained.  °

## 2023-07-26 NOTE — Progress Notes (Signed)
   07/26/23 0843  [facility] Triage Screening (Walk-ins at [facility] only)  How Did You Hear About [place]? Family/Friend  What Is the Reason for Your Visit/Call Today? Kelly Haney is a 37 year old female presenting to [facility] accompanied by her husband. Pts husband reports increased anxiety and that her doctor referred her here. Pt is currently diagnoed with OCD, Anxiety and Depression. Pt reports significant worsening depression and states, "I cant get out of bed". Pt does report to see a therapist and finds it to be very helpful. Pt states, "I can barely make it through the day." Pt mentions that her neighbor makes her want to disappear. Pt is fearful of her neighbor because she pointed a gun at her children and poisoned her chickens. Pt states, "I have had thoughts of wanting to hurt my neighbor, but I would never do anything to hurt her." Pt reports that she self harmed yesterday and has also self harmed in the past. Pt reports that she uses less than a gram of cannabis at night time. Pt endorses suicidal and homicidal thoughts, but does not have a plan to act upon them. Pt is looking for anything to help with this ongoing issues she has. Pt denies alcohol use and AVH.  How Long Has This Been Causing You Problems? 1 wk - 1 month  Have You Recently Had Any Thoughts About Hurting Yourself? Yes  How long ago did you have thoughts about hurting yourself? yesterday  Are You Planning to Commit Suicide/Harm Yourself At This time? No  Have you Recently Had Thoughts About Hurting Someone [name]? Yes  How long ago did you have thoughts of harming others? yesterday  Are You Planning To Harm Someone At This Time? No  Are you currently experiencing any auditory, visual or other hallucinations? No  Have You Used Any Alcohol or Drugs in the Past 24 Hours? Yes  How long ago did you use Drugs or Alcohol? last night  What Did You Use and How Much? cannabis, less than a gram  Do you have any current medical co-morbidities that require  immediate attention? No  Clinician description of patient physical appearance/behavior: calm, cooperative  What Do You Feel Would Help You the Most Today? Treatment for Depression or other mood problem;Stress Management  If access to [facility] was not available, would you have sought care in the Emergency Department? No  Determination of Need Routine (7 days)  Options For Referral Intensive Outpatient Therapy

## 2023-08-11 ENCOUNTER — Other Ambulatory Visit: Payer: Self-pay | Admitting: Cardiology

## 2023-08-11 DIAGNOSIS — I471 Supraventricular tachycardia, unspecified: Secondary | ICD-10-CM

## 2023-08-11 DIAGNOSIS — R55 Syncope and collapse: Secondary | ICD-10-CM

## 2023-08-23 ENCOUNTER — Ambulatory Visit: Payer: 59 | Admitting: Cardiology

## 2023-09-12 NOTE — Progress Notes (Unsigned)
Cardiology Office Note:  .   Date:  09/13/2023  ID:  Kelly Haney, DOB 05/11/1986, MRN [id number] PCP: Carilyn Goodpasture, NP  [facility] Providers Cardiologist:  Yates Decamp, MD {   History of Present Illness: .   Kelly Haney is a 37 y.o. female with a past medical history of chronic palpitations that have been ongoing since her teenage years, loop recorder implanted for frequent palpitations 03/14/2018 was found to have brief episodes of PSVT, mitral valve prolapse and mitral regurgitation (mild) by echocardiogram back in 2019 with a strong family history of premature coronary artery disease with mother having MI at age 48 (scad) then developing CAD and CABG at age 27 or 37 years of age and dying at age 37 (accidental).  She was seen recently for the evaluation of syncope and worsening palpitations.  Last office visit was 05/23/2023 and she lost about 35 pounds and was experiencing recurrent episodes of palpitations, anxiety, low heart rate at night and also syncope.  Patient stated that she is on a single lawsuit and hence has been extremely anxious.  Noticed frequent episodes of palpitations lasting briefly and episodes of sudden onset syncope preceding palpitations.  No injury.  She is wondering if she should have the SVT ablation.  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  Today, she presents with a history of supraventricular tachycardia (SVT),  after a significant weight loss of 65 pounds. She attributes the weight loss to the medication Contrave, which was prescribed due to difficulty losing weight with diet and exercise alone. She also reports having a loop recorder removed after five years due to discomfort, particularly after significant weight loss.  The patient reports ongoing episodes of SVT, which she describes as 'flare ups' that 'come out of nowhere.' She also describes a new symptom of feeling like she is going to 'hit the ground' when standing up, which she describes  as feeling like the color is draining from her face. This symptom is separate from the SVT episodes and occurs when transitioning from sitting to standing.  The patient also reports a heart rate frequently below 50, as monitored by her watch. She is currently on 60mg  of propranolol for anxiety and palpitations.  Reports no shortness of breath nor dyspnea on exertion. Reports no chest pain, pressure, or tightness. No edema, orthopnea, PND.   Discussed the use of AI scribe software for clinical note transcription with the patient, who gave verbal consent to proceed.  ROS: Pertinent ROS in HPI  Studies Reviewed: .        None.      Physical Exam:   VS:  BP (!) 94/58   Pulse 67   Ht 5\' 1"  (1.549 m)   Wt 115 lb 9.6 oz (52.4 kg)   SpO2 99%   BMI 21.84 kg/m    Wt Readings from Last 3 Encounters:  09/13/23 115 lb 9.6 oz (52.4 kg)  05/23/23 118 lb 3.2 oz (53.6 kg)  03/25/23 126 lb (57.2 kg)    GEN: Well nourished, well developed in no acute distress NECK: No JVD; No carotid bruits CARDIAC: RRR, no murmurs, rubs, gallops RESPIRATORY:  Clear to auscultation without rales, wheezing or rhonchi  ABDOMEN: Soft, non-tender, non-distended EXTREMITIES:  No edema; No deformity   ASSESSMENT AND PLAN: .     Orthostatic Hypotension/vasovagal syncope Symptoms of lightheadedness and near syncope upon standing. Blood pressure low at today's visit. Discussed the importance of hydration, increased sodium intake, and use  of compression stockings. - Increase fluid intake to at least 64 ounces daily. - Increase sodium intake with meals and snacks. - Use compression stockings, especially during travel. - Obtain home blood pressure cuff to monitor blood pressure during symptomatic episodes.  Supraventricular Tachycardia (SVT) Reports of recent "flare-ups" and history of syncope. Currently on Propranolol 60mg  daily. - Decrease Propranolol to 40mg  daily due to low heart rate and blood pressure. -  Referral to electrophysiology for further evaluation and potential ablation therapy.  Weight Loss Significant weight loss achieved with the help of Contrave. - Continue current regimen.  General Health Maintenance - Check labs today to assess kidney function and electrolytes. -set up EP referral and appointment.      Dispo: She can follow-up with me in a few months.  Signed, Sharlene Dory, PA-C

## 2023-09-13 ENCOUNTER — Ambulatory Visit: Payer: Medicaid Other | Attending: Physician Assistant | Admitting: Physician Assistant

## 2023-09-13 ENCOUNTER — Ambulatory Visit: Payer: Medicaid Other | Admitting: Cardiology

## 2023-09-13 ENCOUNTER — Encounter: Payer: Self-pay | Admitting: Physician Assistant

## 2023-09-13 VITALS — BP 94/58 | HR 67 | Ht 61.0 in | Wt 115.6 lb

## 2023-09-13 DIAGNOSIS — R55 Syncope and collapse: Secondary | ICD-10-CM | POA: Diagnosis not present

## 2023-09-13 DIAGNOSIS — I471 Supraventricular tachycardia, unspecified: Secondary | ICD-10-CM

## 2023-09-13 DIAGNOSIS — Z95818 Presence of other cardiac implants and grafts: Secondary | ICD-10-CM

## 2023-09-13 MED ORDER — PROPRANOLOL HCL 40 MG PO TABS: 40.0000 mg | ORAL_TABLET | Freq: Every day | ORAL | 3 refills | Status: DC

## 2023-09-13 NOTE — Patient Instructions (Signed)
Medication Instructions:  Decrease Propanolol 40 mg once a day *If you need a refill on your cardiac medications before your next appointment, please call your pharmacy*   Lab Work: Today we will draw CBC, Bmp, and Mag If you have labs (blood work) drawn today and your tests are completely normal, you will receive your results only by: MyChart Message (if you have MyChart) OR A paper copy in the mail If you have any lab test that is abnormal or we need to change your treatment, we will call you to review the results.   Testing/Procedures: No testing   Follow-Up: At [facility], you and your health needs are our priority.  As part of our continuing mission to provide you with exceptional heart care, we have created designated Provider Care Teams.  These Care Teams include your primary Cardiologist (physician) and Advanced Practice Providers (APPs -  Physician Assistants and Nurse Practitioners) who all work together to provide you with the care you need, when you need it.  We recommend signing up for the patient portal called "MyChart".  Sign up information is provided on this After Visit Summary.  MyChart is used to connect with patients for Virtual Visits (Telemedicine).  Patients are able to view lab/test results, encounter notes, upcoming appointments, etc.  Non-urgent messages can be sent to your provider as well.   To learn more about what you can do with MyChart, go to [email or web address].    Your next appointment:   3 month(s)  Provider:   Yates Decamp, MD  or Jari Favre, PA-C     Other Instructions: We will be sending out a referral to the electrophysiology. Please Keep track of blood pressure at home.

## 2023-09-14 LAB — CBC
Hematocrit: 37.9 % (ref 34.0–46.6)
Hemoglobin: 12.3 g/dL (ref 11.1–15.9)
MCH: 31.1 pg (ref 26.6–33.0)
MCHC: 32.5 g/dL (ref 31.5–35.7)
MCV: 96 fL (ref 79–97)
Platelets: 302 10*3/uL (ref 150–450)
RBC: 3.96 x10E6/uL (ref 3.77–5.28)
RDW: 12 % (ref 11.7–15.4)
WBC: 6.5 10*3/uL (ref 3.4–10.8)

## 2023-09-14 LAB — BASIC METABOLIC PANEL
BUN/Creatinine Ratio: 14 (ref 9–23)
BUN: 14 mg/dL (ref 6–20)
CO2: 26 mmol/L (ref 20–29)
Calcium: 9.8 mg/dL (ref 8.7–10.2)
Chloride: 102 mmol/L (ref 96–106)
Creatinine, Ser: 1 mg/dL (ref 0.57–1.00)
Glucose: 92 mg/dL (ref 70–99)
Potassium: 5.1 mmol/L (ref 3.5–5.2)
Sodium: 139 mmol/L (ref 134–144)
eGFR: 74 mL/min/{1.73_m2} (ref >59)

## 2023-09-14 LAB — MAGNESIUM: Magnesium: 2 mg/dL (ref 1.6–2.3)

## 2023-10-30 DIAGNOSIS — Z8639 Personal history of other endocrine, nutritional and metabolic disease: Secondary | ICD-10-CM | POA: Diagnosis not present

## 2023-10-30 DIAGNOSIS — E559 Vitamin D deficiency, unspecified: Secondary | ICD-10-CM | POA: Diagnosis not present

## 2023-10-30 DIAGNOSIS — K219 Gastro-esophageal reflux disease without esophagitis: Secondary | ICD-10-CM | POA: Diagnosis not present

## 2023-10-30 DIAGNOSIS — E663 Overweight: Secondary | ICD-10-CM | POA: Diagnosis not present

## 2023-10-30 DIAGNOSIS — Z6820 Body mass index (BMI) 20.0-20.9, adult: Secondary | ICD-10-CM | POA: Diagnosis not present

## 2023-10-30 DIAGNOSIS — F411 Generalized anxiety disorder: Secondary | ICD-10-CM | POA: Diagnosis not present

## 2023-10-30 DIAGNOSIS — F329 Major depressive disorder, single episode, unspecified: Secondary | ICD-10-CM | POA: Diagnosis not present

## 2023-10-30 DIAGNOSIS — Z9189 Other specified personal risk factors, not elsewhere classified: Secondary | ICD-10-CM | POA: Diagnosis not present

## 2023-10-30 DIAGNOSIS — Z8679 Personal history of other diseases of the circulatory system: Secondary | ICD-10-CM | POA: Diagnosis not present

## 2023-10-30 DIAGNOSIS — E7849 Other hyperlipidemia: Secondary | ICD-10-CM | POA: Diagnosis not present

## 2023-11-02 ENCOUNTER — Ambulatory Visit: Payer: Medicaid Other | Admitting: Cardiology

## 2023-12-11 ENCOUNTER — Ambulatory Visit (INDEPENDENT_AMBULATORY_CARE_PROVIDER_SITE_OTHER): Payer: Medicaid Other

## 2023-12-11 ENCOUNTER — Encounter: Payer: Self-pay | Admitting: Cardiology

## 2023-12-11 ENCOUNTER — Ambulatory Visit: Payer: Medicaid Other | Attending: Cardiology | Admitting: Cardiology

## 2023-12-11 VITALS — BP 104/60 | HR 58 | Ht 61.0 in | Wt 113.0 lb

## 2023-12-11 DIAGNOSIS — I471 Supraventricular tachycardia, unspecified: Secondary | ICD-10-CM | POA: Diagnosis not present

## 2023-12-11 DIAGNOSIS — R002 Palpitations: Secondary | ICD-10-CM

## 2023-12-11 NOTE — Progress Notes (Unsigned)
 Enrolled for [employer] to mail a ZIO XT long term holter monitor to the patients address on file.

## 2023-12-11 NOTE — Progress Notes (Signed)
  Electrophysiology Office Note:   Date:  12/11/2023  ID:  Kelly Haney, DOB 03-15-1986, MRN [id number]  Primary Cardiologist: Yates Decamp, MD Primary Heart Failure: None Electrophysiologist: None      History of Present Illness:   Kelly Haney is a 38 y.o. female with h/o SVT seen today for  for Electrophysiology evaluation of SVT at the request of Jari Favre.    He has a history of SVT.  She has had palpitations for quite some time.  She had an ILR implanted in 2019 which showed episodes of SVT.  She has a strong family history of premature coronary artery disease.  Unfortunately she has had ongoing episodes of SVT.  She feels near syncopal during these episodes.  She has these episodes a few times a week.  Since being started on propranolol, she has not had any syncope, but does feel like she needs to sit down or kneel down on the ground during these episodes.  She has been feeling this way for the last few years.  Review of systems complete and found to be negative unless listed in HPI.   EP Information / Studies Reviewed:    EKG is ordered today. Personal review as below.  EKG Interpretation Date/Time:  Monday December 11 2023 14:58:20 EST Ventricular Rate:  58 PR Interval:  182 QRS Duration:  88 QT Interval:  432 QTC Calculation: 424 R Axis:   87  Text Interpretation: Sinus bradycardia When compared with ECG of 02-Sep-2021 15:26, No significant change since last tracing Confirmed by Mersadez Linden ([id number]) on 12/11/2023 3:01:47 PM     Risk Assessment/Calculations:             Physical Exam:   VS:  BP 104/60 (BP Location: Left Arm, Patient Position: Sitting, Cuff Size: Normal)   Pulse (!) 58   Ht 5\' 1"  (1.549 m)   Wt 113 lb (51.3 kg)   SpO2 95%   BMI 21.35 kg/m    Wt Readings from Last 3 Encounters:  12/11/23 113 lb (51.3 kg)  09/13/23 115 lb 9.6 oz (52.4 kg)  05/23/23 118 lb 3.2 oz (53.6 kg)     GEN: Well nourished, well developed in no acute distress NECK: No JVD;  No carotid bruits CARDIAC: Regular rate and rhythm, no murmurs, rubs, gallops RESPIRATORY:  Clear to auscultation without rales, wheezing or rhonchi  ABDOMEN: Soft, non-tender, non-distended EXTREMITIES:  No edema; No deformity   ASSESSMENT AND PLAN:    1.  SVT: Currently on propranolol 40 mg daily.  She is feeling well currently, but does have recurrent palpitations.  She has not had syncope but has had near syncope during these episodes.  They occur multiple times a week.  Unfortunately, there are no rhythm strips in epic to document this.  We Beyonka Pitney have her wear a 2-week monitor.  If this does show episodes of SVT, we Brelynn Wheller plan for ablation.  Risks and benefits have been discussed.  Risk include bleeding, tamponade, heart block, stroke, MI, renal failure, death.  She understands these risks and is agreed to the procedure.  If she wishes after the monitor, could start low-dose flecainide as a bridge to ablation.  2.  Orthostatic hypotension: Currently managed by primary cardiology  Follow up with Dr. Elberta Fortis as usual post procedure  Signed, Makel Mcmann Jorja Loa, MD

## 2023-12-11 NOTE — Addendum Note (Signed)
 Addended by: Baird Lyons on: 12/11/2023 03:24 PM   Modules accepted: Orders

## 2023-12-11 NOTE — Patient Instructions (Addendum)
Medication Instructions:  Your physician recommends that you continue on your current medications as directed. Please refer to the Current Medication list given to you today.  *If you need a refill on your cardiac medications before your next appointment, please call your pharmacy*   Lab Work: None ordered If you have labs (blood work) drawn today and your tests are completely normal, you will receive your results only by: MyChart Message (if you have MyChart) OR A paper copy in the mail If you have any lab test that is abnormal or we need to change your treatment, we will call you to review the results.   Testing/Procedures: Christena Deem- Long Term Monitor Instructions  Your physician has requested you wear a ZIO patch monitor for 14 days.  This is a single patch monitor. [employer] supplies one patch monitor per enrollment. Additional stickers are not available. Please do not apply patch if you will be having a Nuclear Stress Test,  Echocardiogram, Cardiac CT, MRI, or Chest Xray during the period you would be wearing the  monitor. The patch cannot be worn during these tests. You cannot remove and re-apply the  ZIO XT patch monitor.  Your ZIO patch monitor will be mailed 3 day USPS to your address on file. It may take 3-5 days  to receive your monitor after you have been enrolled.  Once you have received your monitor, please review the enclosed instructions. Your monitor  has already been registered assigning a specific monitor serial # to you.  Billing and Patient Assistance Program Information  We have supplied [employer] with any of your insurance information on file for billing purposes. [employer] offers a sliding scale Patient Assistance Program for patients that do not have  insurance, or whose insurance does not completely cover the cost of the ZIO monitor.  You must apply for the Patient Assistance Program to qualify for this discounted rate.  To apply, please call [employer] at  [phone], select option 4, select option 2, ask to apply for  Patient Assistance Program. Meredeth Ide will ask your household income, and how many people  are in your household. They will quote your out-of-pocket cost based on that information.  [employer] will also be able to set up a 28-month, interest-free payment plan if needed.  Applying the monitor   Shave hair from upper left chest.  Hold abrader disc by orange tab. Rub abrader in 40 strokes over the upper left chest as  indicated in your monitor instructions.  Clean area with 4 enclosed alcohol pads. Let dry.  Apply patch as indicated in monitor instructions. Patch will be placed under collarbone on left  side of chest with arrow pointing upward.  Rub patch adhesive wings for 2 minutes. Remove white label marked "1". Remove the white  label marked "2". Rub patch adhesive wings for 2 additional minutes.  While looking in a mirror, press and release button in center of patch. A small green light will  flash 3-4 times. This will be your only indicator that the monitor has been turned on.  Do not shower for the first 24 hours. You may shower after the first 24 hours.  Press the button if you feel a symptom. You will hear a small click. Record Date, Time and  Symptom in the Patient Logbook.  When you are ready to remove the patch, follow instructions on the last 2 pages of Patient  Logbook. Stick patch monitor onto the last page of Patient Logbook.  Place Patient Logbook  in the blue and white box. Use locking tab on box and tape box closed  securely. The blue and white box has prepaid postage on it. Please place it in the mailbox as  soon as possible. Your physician should have your test results approximately 7 days after the  monitor has been mailed back to [facility].  Call [facility] Customer Care at [phone] if you have questions regarding  your ZIO XT patch monitor. Call them immediately if you see an orange light  blinking on your  monitor.  If your monitor falls off in less than 4 days, contact our Monitor department at [phone].  If your monitor becomes loose or falls off after 4 days call [employer] at [phone] for  suggestions on securing your monitor    Follow-Up: At [facility], you and your health needs are our priority.  As part of our continuing mission to provide you with exceptional heart care, we have created designated Provider Care Teams.  These Care Teams include your primary Cardiologist (physician) and Advanced Practice Providers (APPs -  Physician Assistants and Nurse Practitioners) who all work together to provide you with the care you need, when you need it.  Your next appointment:   To be  determined after monitor is completed/reviewed  The format for your next appointment:   In Person  Provider:   Loman Brooklyn, MD    Thank you for choosing [facility]!!   Dory Horn, RN [phone]  Other Instructions

## 2023-12-14 ENCOUNTER — Encounter: Payer: Self-pay | Admitting: Cardiology

## 2023-12-14 ENCOUNTER — Ambulatory Visit: Payer: Medicaid Other | Attending: Cardiology | Admitting: Cardiology

## 2023-12-14 VITALS — BP 102/72 | HR 77 | Resp 16 | Ht 61.0 in | Wt 114.2 lb

## 2023-12-14 DIAGNOSIS — I471 Supraventricular tachycardia, unspecified: Secondary | ICD-10-CM | POA: Diagnosis not present

## 2023-12-14 DIAGNOSIS — R002 Palpitations: Secondary | ICD-10-CM | POA: Diagnosis not present

## 2023-12-14 DIAGNOSIS — I341 Nonrheumatic mitral (valve) prolapse: Secondary | ICD-10-CM

## 2023-12-14 NOTE — Patient Instructions (Signed)

## 2023-12-14 NOTE — Progress Notes (Signed)
 Cardiology Office Note:  .   Date:  12/14/2023  ID:  Barry Culverhouse, DOB Mar 22, 1986, MRN [id number] PCP: Carilyn Goodpasture, NP  [facility] Providers Cardiologist:  Yates Decamp, MD   History of Present Illness: .   Kelly Haney is a 38 y.o. with chronic palpitations that have been ongoing since teenage years, Loop recorder implanted for frequent palpitations on 03/14/2018, explanted due to end-of-life and was found to have brief episodes of PSVT, Mitral valve prolapse and mild mitral regurgitation by echocardiogram in 2019, strong family history of premature coronary artery disease and mother had myocardial infarction at age 33 (SCAD) and then developed CAD and CABG at age 54 or 57 years, died at age 67 (accidental).  She presents here to follow-up on EP evaluation and palpitations.  She is presently not wearing Zio patch as she is waiting for it to be mailed to her.  Except for recurrent palpitations including 1 she had yesterday that lasted for about couple minutes and made her feel extremely nervous she has not had any other issues from cardiac standpoint, no syncope.   Labs    Lab Results  Component Value Date   NA 139 09/13/2023   K 5.1 09/13/2023   CO2 26 09/13/2023   GLUCOSE 92 09/13/2023   BUN 14 09/13/2023   CREATININE 1.00 09/13/2023   CALCIUM 9.8 09/13/2023   EGFR 74 09/13/2023   GFRNONAA >60 09/02/2021      Latest Ref Rng & Units 09/13/2023   10:52 AM 09/02/2021   12:39 PM  BMP  Glucose 70 - 99 mg/dL 92  94   BUN 6 - 20 mg/dL 14  12   Creatinine 4.09 - 1.00 mg/dL 8.11  9.14   BUN/Creat Ratio 9 - 23 14    Sodium 134 - 144 mmol/L 139  138   Potassium 3.5 - 5.2 mmol/L 5.1  4.2   Chloride 96 - 106 mmol/L 102  103   CO2 20 - 29 mmol/L 26  28   Calcium 8.7 - 10.2 mg/dL 9.8  9.4       Latest Ref Rng & Units 09/13/2023   10:52 AM 09/02/2021   12:39 PM 11/15/2018    5:57 AM  CBC  WBC 3.4 - 10.8 x10E3/uL 6.5  7.0  16.1   Hemoglobin 11.1 - 15.9 g/dL 78.2  95.6  9.0    Hematocrit 34.0 - 46.6 % 37.9  39.8  27.6   Platelets 150 - 450 x10E3/uL 302  315  197    PCP labs 04/05/2023:  Hb 11.6/HCT 35.4, platelets 290, normal indicis.  Total cholesterol 189, triglycerides 73, HDL 49, LDL 126.  Review of Systems  Cardiovascular:  Positive for palpitations. Negative for chest pain, dyspnea on exertion and leg swelling.   Physical Exam:   VS:  BP 102/72 (BP Location: Left Arm, Patient Position: Sitting, Cuff Size: Normal)   Pulse 77   Resp 16   Ht 5\' 1"  (1.549 m)   Wt 114 lb 3.2 oz (51.8 kg)   SpO2 95%   BMI 21.58 kg/m    Wt Readings from Last 3 Encounters:  12/14/23 114 lb 3.2 oz (51.8 kg)  12/11/23 113 lb (51.3 kg)  09/13/23 115 lb 9.6 oz (52.4 kg)     Physical Exam Neck:     Vascular: No carotid bruit or JVD.  Cardiovascular:     Rate and Rhythm: Normal rate and regular rhythm.     Pulses: Intact distal pulses.  Heart sounds: Normal heart sounds. No murmur heard.    No gallop.  Pulmonary:     Effort: Pulmonary effort is normal.     Breath sounds: Normal breath sounds.  Abdominal:     General: Bowel sounds are normal.     Palpations: Abdomen is soft.  Musculoskeletal:     Right lower leg: No edema.     Left lower leg: No edema.    Studies Reviewed: .    Echocardiogram 02/13/2023: Normal LV systolic function with visual EF 60-65%. Left ventricle cavity is normal in size. Normal left ventricular wall thickness. Normal global wall motion. Normal diastolic filling pattern, normal LAP. Calculated EF 61%. Mild (Grade I) mitral regurgitation. Mild prolapse of the mitral valve leaflets. Structurally normal tricuspid valve.  Mild tricuspid regurgitation. No evidence of pulmonary hypertension. IVC is dilated with respiratory variation. No significant change compared to 07/2018.  EKG:         Medications and allergies    Allergies  Allergen Reactions   Bee Venom Anaphylaxis   Shellfish Allergy Anaphylaxis   Penicillins Rash    DID  THE REACTION INVOLVE: Swelling of the face/tongue/throat, SOB, or low BP? No Sudden or severe rash/hives, skin peeling, or the inside of the mouth or nose? No Did it require medical treatment? No When did it last happen?      More than 2 years If all above answers are "NO", may proceed with cephalosporin use.      Current Outpatient Medications:    albuterol (VENTOLIN HFA) 108 (90 Base) MCG/ACT inhaler, Inhale 2 puffs into the lungs every 4 (four) hours as needed., Disp: , Rfl:    AUVELITY 45-105 MG TBCR, Take 1 tablet by mouth 2 (two) times daily., Disp: , Rfl:    clonazePAM (KLONOPIN) 1 MG tablet, Take 1 mg by mouth 2 (two) times daily., Disp: , Rfl:    EPINEPHrine 0.3 mg/0.3 mL IJ SOAJ injection, Inject 0.3 mg into the muscle as needed for anaphylaxis., Disp: 1 each, Rfl: 0   ibuprofen (ADVIL) 800 MG tablet, Take 1 tablet (800 mg total) by mouth 3 (three) times daily., Disp: 21 tablet, Rfl: 0   pantoprazole (PROTONIX) 40 MG tablet, Take 40 mg by mouth 2 (two) times daily., Disp: , Rfl:    propranolol (INDERAL) 40 MG tablet, Take 1 tablet (40 mg total) by mouth daily., Disp: 90 tablet, Rfl: 3   traZODone (DESYREL) 50 MG tablet, Take 50 mg by mouth at bedtime., Disp: , Rfl:    VRAYLAR 1.5 MG capsule, Take 1.5 mg by mouth daily., Disp: , Rfl:    VRAYLAR 3 MG capsule, Take 3 mg by mouth daily., Disp: , Rfl:    ASSESSMENT AND PLAN: .      ICD-10-CM   1. PSVT (paroxysmal supraventricular tachycardia) (HCC)  I47.10     2. Palpitations  R00.2     3. Mitral valve prolapse  I34.1       1. PSVT (paroxysmal supraventricular tachycardia) (HCC) Patient with PSVT, now being evaluated by EP for possible ablation.  I have no further input, she is presently on beta-blocker therapy with propranolol 1 tablet daily but can take extra dose if necessary.  2. Palpitations Related to PSVT and PACs.  3. Mitral valve prolapse Very mild mitral prolapse with mild mitral regurgitation, no further  evaluation is indicated with regard to this.  Patient is now well evaluated by EP and has now established, no further evaluation from general cardiology is  indicated for now, I will see her back on a as needed basis.  Her LDL was markedly elevated previously however with weight loss, now LDL is around 125.  She has no vascular risk factors except mother did have scad and eventually developed CAD.  She is presently 38 years of age, could consider initiation of statins if LDL elevated increases to >621.  Also at age 74 we could consider coronary calcium score evaluation.  Signed,  Yates Decamp, MD, [facility] 12/14/2023, 11:17 AM [facility] [address] [place], [place] [address] Phone: [phone]. Fax:  [phone]

## 2023-12-18 DIAGNOSIS — I471 Supraventricular tachycardia, unspecified: Secondary | ICD-10-CM | POA: Diagnosis not present

## 2023-12-18 DIAGNOSIS — R002 Palpitations: Secondary | ICD-10-CM

## 2023-12-28 DIAGNOSIS — E7849 Other hyperlipidemia: Secondary | ICD-10-CM | POA: Diagnosis not present

## 2023-12-28 DIAGNOSIS — K219 Gastro-esophageal reflux disease without esophagitis: Secondary | ICD-10-CM | POA: Diagnosis not present

## 2023-12-28 DIAGNOSIS — E559 Vitamin D deficiency, unspecified: Secondary | ICD-10-CM | POA: Diagnosis not present

## 2023-12-28 DIAGNOSIS — F411 Generalized anxiety disorder: Secondary | ICD-10-CM | POA: Diagnosis not present

## 2023-12-28 DIAGNOSIS — Z8679 Personal history of other diseases of the circulatory system: Secondary | ICD-10-CM | POA: Diagnosis not present

## 2023-12-28 DIAGNOSIS — Z9189 Other specified personal risk factors, not elsewhere classified: Secondary | ICD-10-CM | POA: Diagnosis not present

## 2023-12-28 DIAGNOSIS — Z8639 Personal history of other endocrine, nutritional and metabolic disease: Secondary | ICD-10-CM | POA: Diagnosis not present

## 2023-12-28 DIAGNOSIS — F329 Major depressive disorder, single episode, unspecified: Secondary | ICD-10-CM | POA: Diagnosis not present

## 2023-12-28 DIAGNOSIS — E663 Overweight: Secondary | ICD-10-CM | POA: Diagnosis not present

## 2023-12-28 DIAGNOSIS — Z6820 Body mass index (BMI) 20.0-20.9, adult: Secondary | ICD-10-CM | POA: Diagnosis not present

## 2024-01-18 ENCOUNTER — Ambulatory Visit: Payer: Self-pay | Admitting: Cardiology

## 2024-01-25 DIAGNOSIS — E559 Vitamin D deficiency, unspecified: Secondary | ICD-10-CM | POA: Diagnosis not present

## 2024-01-25 DIAGNOSIS — K219 Gastro-esophageal reflux disease without esophagitis: Secondary | ICD-10-CM | POA: Diagnosis not present

## 2024-01-25 DIAGNOSIS — Z6820 Body mass index (BMI) 20.0-20.9, adult: Secondary | ICD-10-CM | POA: Diagnosis not present

## 2024-01-25 DIAGNOSIS — Z8639 Personal history of other endocrine, nutritional and metabolic disease: Secondary | ICD-10-CM | POA: Diagnosis not present

## 2024-01-25 DIAGNOSIS — Z8679 Personal history of other diseases of the circulatory system: Secondary | ICD-10-CM | POA: Diagnosis not present

## 2024-01-25 DIAGNOSIS — F329 Major depressive disorder, single episode, unspecified: Secondary | ICD-10-CM | POA: Diagnosis not present

## 2024-01-25 DIAGNOSIS — E7849 Other hyperlipidemia: Secondary | ICD-10-CM | POA: Diagnosis not present

## 2024-01-25 DIAGNOSIS — E663 Overweight: Secondary | ICD-10-CM | POA: Diagnosis not present

## 2024-01-25 DIAGNOSIS — Z9189 Other specified personal risk factors, not elsewhere classified: Secondary | ICD-10-CM | POA: Diagnosis not present

## 2024-01-25 DIAGNOSIS — F411 Generalized anxiety disorder: Secondary | ICD-10-CM | POA: Diagnosis not present

## 2024-03-27 ENCOUNTER — Encounter: Payer: Self-pay | Admitting: Cardiology

## 2024-04-05 NOTE — Telephone Encounter (Signed)
 Kelly Haney called in asking stating she has not received email. She asked that It be sent again.  She did confirm email address is correct: [email or web address]

## 2024-09-07 ENCOUNTER — Other Ambulatory Visit: Payer: Self-pay

## 2024-09-07 ENCOUNTER — Encounter (HOSPITAL_COMMUNITY): Payer: Self-pay | Admitting: *Deleted

## 2024-09-07 ENCOUNTER — Emergency Department (HOSPITAL_COMMUNITY)
Admission: EM | Admit: 2024-09-07 | Discharge: 2024-09-08 | Disposition: A | Discharge status: Home / Self Care | Attending: Emergency Medicine | Admitting: Emergency Medicine

## 2024-09-07 DIAGNOSIS — N12 Tubulo-interstitial nephritis, not specified as acute or chronic: Secondary | ICD-10-CM | POA: Diagnosis not present

## 2024-09-07 DIAGNOSIS — R10A1 Flank pain, right side: Secondary | ICD-10-CM | POA: Diagnosis present

## 2024-09-07 LAB — URINALYSIS, MICROSCOPIC (REFLEX)

## 2024-09-07 LAB — CBC
HCT: 38.5 % (ref 36.0–46.0)
Hemoglobin: 12.8 g/dL (ref 12.0–15.0)
MCH: 30.9 pg (ref 26.0–34.0)
MCHC: 33.2 g/dL (ref 30.0–36.0)
MCV: 93 fL (ref 80.0–100.0)
Platelets: 346 K/uL (ref 150–400)
RBC: 4.14 MIL/uL (ref 3.87–5.11)
RDW: 11.9 % (ref 11.5–15.5)
WBC: 9.4 K/uL (ref 4.0–10.5)
nRBC: 0 % (ref 0.0–0.2)

## 2024-09-07 LAB — URINALYSIS, ROUTINE W REFLEX MICROSCOPIC
Bilirubin Urine: NEGATIVE
Glucose, UA: NEGATIVE mg/dL
Ketones, ur: NEGATIVE mg/dL
Nitrite: POSITIVE — AB
Protein, ur: NEGATIVE mg/dL
Specific Gravity, Urine: 1.025 (ref 1.005–1.030)
pH: 6 (ref 5.0–8.0)

## 2024-09-07 LAB — COMPREHENSIVE METABOLIC PANEL WITH GFR
ALT: 7 U/L (ref 0–44)
AST: 15 U/L (ref 15–41)
Albumin: 4.3 g/dL (ref 3.5–5.0)
Alkaline Phosphatase: 60 U/L (ref 38–126)
Anion gap: 12 (ref 5–15)
BUN: 9 mg/dL (ref 6–20)
CO2: 25 mmol/L (ref 22–32)
Calcium: 9.4 mg/dL (ref 8.9–10.3)
Chloride: 100 mmol/L (ref 98–111)
Creatinine, Ser: 1.01 mg/dL — ABNORMAL HIGH (ref 0.44–1.00)
GFR, Estimated: >60 mL/min (ref >60)
Glucose, Bld: 118 mg/dL — ABNORMAL HIGH (ref 70–99)
Potassium: 3.5 mmol/L (ref 3.5–5.1)
Sodium: 137 mmol/L (ref 135–145)
Total Bilirubin: 0.7 mg/dL (ref 0.0–1.2)
Total Protein: 7.3 g/dL (ref 6.5–8.1)

## 2024-09-07 LAB — HCG, SERUM, QUALITATIVE: Preg, Serum: NEGATIVE

## 2024-09-07 LAB — LIPASE, BLOOD: Lipase: 24 U/L (ref 11–51)

## 2024-09-07 MED ORDER — ONDANSETRON 4 MG PO TBDP
8.0000 mg | ORAL_TABLET | Freq: Once | ORAL | Status: AC
  Administered 2024-09-07: 8 mg via ORAL
  Filled 2024-09-07: qty 2

## 2024-09-07 MED ORDER — OXYCODONE-ACETAMINOPHEN 5-325 MG PO TABS
1.0000 | ORAL_TABLET | Freq: Once | ORAL | Status: AC
  Administered 2024-09-07: 1 via ORAL
  Filled 2024-09-07: qty 1

## 2024-09-07 NOTE — ED Triage Notes (Signed)
 Pt reports left flank pain that started a couple days ago that's progressively worsened. Also reports nausea and a fever at home

## 2024-09-08 DIAGNOSIS — N12 Tubulo-interstitial nephritis, not specified as acute or chronic: Secondary | ICD-10-CM | POA: Diagnosis not present

## 2024-09-08 MED ORDER — SODIUM CHLORIDE 0.9 % IV SOLN
1.0000 g | Freq: Once | INTRAVENOUS | Status: AC
  Administered 2024-09-08: 1 g via INTRAVENOUS
  Filled 2024-09-08: qty 10

## 2024-09-08 MED ORDER — KETOROLAC TROMETHAMINE 15 MG/ML IJ SOLN
15.0000 mg | Freq: Once | INTRAMUSCULAR | Status: AC
  Administered 2024-09-08: 15 mg via INTRAVENOUS
  Filled 2024-09-08: qty 1

## 2024-09-08 MED ORDER — ONDANSETRON 4 MG PO TBDP: 4.0000 mg | ORAL_TABLET | Freq: Three times a day (TID) | ORAL | 0 refills | Status: AC | PRN

## 2024-09-08 MED ORDER — SODIUM CHLORIDE 0.9 % IV BOLUS
500.0000 mL | Freq: Once | INTRAVENOUS | Status: AC
  Administered 2024-09-08: 500 mL via INTRAVENOUS

## 2024-09-08 MED ORDER — CEPHALEXIN 500 MG PO CAPS: 500.0000 mg | ORAL_CAPSULE | Freq: Four times a day (QID) | ORAL | 0 refills | Status: AC

## 2024-09-08 MED ORDER — NAPROXEN 500 MG PO TABS: 500.0000 mg | ORAL_TABLET | Freq: Two times a day (BID) | ORAL | 0 refills | Status: AC | PRN

## 2024-09-08 NOTE — Discharge Instructions (Addendum)
 Take Keflex  as prescribed until finished.  You may use naproxen for pain.  We have prescribed a short course of Zofran  to use for nausea management.  Follow-up with your primary care doctor to ensure resolving infection.  Return for new or concerning symptoms.

## 2024-09-08 NOTE — ED Provider Notes (Signed)
 [place] EMERGENCY DEPARTMENT AT [facility] Provider Note   CSN: [id number] Arrival date & time: 09/07/24  2131     Patient presents with: No chief complaint on file.   Joel Mericle is a 38 y.o. female.    38 year old female presents to the emergency department for evaluation of RIGHT sided flank pain.  Her symptoms began a few days ago.  She has some radiation to her right mid and lower abdomen.  Notes associated fever up to 101F today.  She attempted to take antipyretics, but experienced vomiting shortly after.  She has had ongoing nausea.  Denies sick contacts, dysuria, hematuria or frequency/urgency, bowel changes.  Abdominal surgical history significant for cesarean section as well as tubal ligation.  The history is provided by the patient. No language interpreter was used.      Prior to Admission medications   Medication Sig Start Date End Date Taking? Authorizing Provider  cephALEXin  (KEFLEX ) 500 MG capsule Take 1 capsule (500 mg total) by mouth 4 (four) times daily for 7 days. 09/08/24 09/15/24 Yes Keith Sor, PA-C  naproxen (NAPROSYN) 500 MG tablet Take 1 tablet (500 mg total) by mouth every 12 (twelve) hours as needed for moderate pain (pain score 4-6) or mild pain (pain score 1-3). 09/08/24  Yes Keith Sor, PA-C  ondansetron  (ZOFRAN -ODT) 4 MG disintegrating tablet Take 1 tablet (4 mg total) by mouth every 8 (eight) hours as needed for nausea or vomiting. 09/08/24  Yes Keith Sor, PA-C  albuterol (VENTOLIN HFA) 108 (90 Base) MCG/ACT inhaler Inhale 2 puffs into the lungs every 4 (four) hours as needed. 01/12/23   [provider]  AUVELITY 45-105 MG TBCR Take 1 tablet by mouth 2 (two) times daily. 08/19/23   [provider]  clonazePAM (KLONOPIN) 1 MG tablet Take 1 mg by mouth 2 (two) times daily. 08/21/23   [provider]  EPINEPHrine  0.3 mg/0.3 mL IJ SOAJ injection Inject 0.3 mg into the muscle as needed for anaphylaxis. 06/04/22    Yolande Lamar BROCKS, MD  ibuprofen  (ADVIL ) 800 MG tablet Take 1 tablet (800 mg total) by mouth 3 (three) times daily. 02/20/21   Wieters, Hallie C, PA-C  pantoprazole  (PROTONIX ) 40 MG tablet Take 40 mg by mouth 2 (two) times daily. 12/29/22   [provider]  propranolol  (INDERAL ) 40 MG tablet Take 1 tablet (40 mg total) by mouth daily. 09/13/23   Lucien Orren SAILOR, PA-C  traZODone (DESYREL) 50 MG tablet Take 50 mg by mouth at bedtime. 09/12/23   [provider]  VRAYLAR 1.5 MG capsule Take 1.5 mg by mouth daily. 09/12/23   [provider]  VRAYLAR 3 MG capsule Take 3 mg by mouth daily. 09/10/23   [provider]    Allergies: Bee venom, Shellfish allergy, and Penicillins    Review of Systems Ten systems reviewed and are negative for acute change, except as noted in the HPI.    Updated Vital Signs BP 100/63 (BP Location: Right Arm)   Pulse (!) 55   Temp 98 F (36.7 C) (Oral)   Resp 15   Ht 5' 1 (1.549 m)   Wt 51.8 kg   LMP 09/04/2024   SpO2 100%   BMI 21.58 kg/m   Physical Exam Vitals and nursing note reviewed.  Constitutional:      General: She is not in acute distress.    Appearance: She is well-developed. She is not diaphoretic.     Comments: Nontoxic appearing and in NAD  HENT:     Head: Normocephalic and atraumatic.  Eyes:     General: No scleral icterus.    Conjunctiva/sclera: Conjunctivae normal.  Cardiovascular:     Rate and Rhythm: Normal rate and regular rhythm.     Pulses: Normal pulses.  Pulmonary:     Effort: Pulmonary effort is normal. No respiratory distress.     Comments: Respirations even and unlabored Abdominal:     Palpations: Abdomen is soft. There is no mass.     Tenderness: There is no abdominal tenderness. There is no guarding.     Comments: No focal TTP on exam. No peritoneal signs or masses.  Musculoskeletal:        General: Normal range of motion.     Cervical back: Normal range of motion.  Skin:    General:  Skin is warm and dry.     Coloration: Skin is not pale.     Findings: No erythema or rash.  Neurological:     Mental Status: She is alert and oriented to person, place, and time.     Coordination: Coordination normal.  Psychiatric:        Behavior: Behavior normal.     (all labs ordered are listed, but only abnormal results are displayed) Labs Reviewed  COMPREHENSIVE METABOLIC PANEL WITH GFR - Abnormal; Notable for the following components:      Result Value   Glucose, Bld 118 (*)    Creatinine, Ser 1.01 (*)    All other components within normal limits  URINALYSIS, ROUTINE W REFLEX MICROSCOPIC - Abnormal; Notable for the following components:   APPearance HAZY (*)    Hgb urine dipstick SMALL (*)    Nitrite POSITIVE (*)    Leukocytes,Ua SMALL (*)    All other components within normal limits  URINALYSIS, MICROSCOPIC (REFLEX) - Abnormal; Notable for the following components:   Bacteria, UA MANY (*)    All other components within normal limits  URINE CULTURE  LIPASE, BLOOD  CBC  HCG, SERUM, QUALITATIVE    EKG: None  Radiology: No results found.   Procedures   Medications Ordered in the ED  ondansetron  (ZOFRAN -ODT) disintegrating tablet 8 mg (8 mg Oral Given 09/07/24 2153)  oxyCODONE -acetaminophen  (PERCOCET/ROXICET) 5-325 MG per tablet 1 tablet (1 tablet Oral Given 09/07/24 2153)  ketorolac  (TORADOL ) 15 MG/ML injection 15 mg (15 mg Intravenous Given 09/08/24 0306)  cefTRIAXone (ROCEPHIN) 1 g in sodium chloride  0.9 % 100 mL IVPB (0 g Intravenous Stopped 09/08/24 0340)  sodium chloride  0.9 % bolus 500 mL (0 mLs Intravenous Stopped 09/08/24 0340)                                    Medical Decision Making Amount and/or Complexity of Data Reviewed Labs: ordered.  Risk Prescription drug management.   This patient presents to the ED for concern of flank pain, this involves an extensive number of treatment options, and is a complaint that carries with it a high risk of  complications and morbidity.  The differential diagnosis includes pyelonephritis vs kidney stone vs renal abscess vs biliary colic vs PNA vs retroperitoneal hematoma   Co morbidities that complicate the patient evaluation  None known   Additional history obtained:  Additional history obtained from spouse, at bedside External records from outside source obtained and reviewed including prior discharge summaries   Lab Tests:  I Ordered, and personally interpreted labs.  The pertinent  results include:  UA c/w UTI (bacteriuria, pyuria, nitrites). Creatinine 1.01.   Cardiac Monitoring:  The patient was maintained on a cardiac monitor.  I personally viewed and interpreted the cardiac monitored which showed an underlying rhythm of: Sinus bradycardia   Medicines ordered and prescription drug management:  I ordered medication including Rocephin for pyelonephritis and Toradol  for pain  Reevaluation of the patient after these medicines showed that the patient improved I have reviewed the patients home medicines and have made adjustments as needed   Test Considered:  CT renal   Problem List / ED Course:  Symptoms clinically consistent with pyelonephritis.  UA with many bacteria, 21-50 WBCs, positive nitrites. She reports a fever up to 101F today.  Has been afebrile since ED arrival.  No criteria for SIRS/sepsis.  Was given a dose of IV Rocephin prior to discharge.  Advised continuation of Keflex  as an outpatient pending urine culture sensitivities.   Reevaluation:  After the interventions noted above, I reevaluated the patient and found that they have :improved   Social Determinants of Health:  Good social support   Dispostion:  After consideration of the diagnostic results and the patients response to treatment, I feel that the patent would benefit from outpatient course of abx. Encouraged PCP f/u for recheck. Return precautions discussed and provided. Patient discharged in  stable condition with no unaddressed concerns.       Final diagnoses:  Pyelonephritis    ED Discharge Orders          Ordered    cephALEXin  (KEFLEX ) 500 MG capsule  4 times daily        09/08/24 0339    naproxen (NAPROSYN) 500 MG tablet  Every 12 hours PRN        09/08/24 0339    ondansetron  (ZOFRAN -ODT) 4 MG disintegrating tablet  Every 8 hours PRN        09/08/24 0339               Keith Sor, PA-C 09/08/24 0357    Jerral Meth, MD 09/08/24 2304

## 2024-09-10 LAB — URINE CULTURE: Culture: >=100000 — AB

## 2024-09-11 ENCOUNTER — Telehealth (HOSPITAL_BASED_OUTPATIENT_CLINIC_OR_DEPARTMENT_OTHER): Payer: Self-pay | Admitting: Emergency Medicine

## 2024-09-11 NOTE — Telephone Encounter (Signed)
 Post ED Visit - Positive Culture Follow-up  Culture report reviewed by antimicrobial stewardship pharmacist: [facility] []  Rankin Dee, Pharm.D. []  Venetia Gully, [address].D., BCPS AQ-ID []  Garrel Crews, Pharm.D., BCPS []  Almarie Lunger, Pharm.D., BCPS []  [place], [place].D., BCPS, AAHIVP []  Rosaline Bihari, Pharm.D., BCPS, AAHIVP [x]  Vernell Meier, PharmD, BCPS []  Latanya Hint, PharmD, BCPS []  Donald Medley, PharmD, BCPS []  Rocky Bold, PharmD []  Dorothyann Alert, PharmD, BCPS []  Morene Babe, PharmD  [facility] []  Rosaline Edison, PharmD []  Romona Bliss, PharmD []  Dolphus Roller, PharmD []  Veva Seip, Rph []  Vernell Daunt) Leonce, PharmD []  Eva Allis, PharmD []  Rosaline Millet, PharmD []  Iantha Batch, PharmD []  Arvin Gauss, PharmD []  Wanda Hasting, PharmD []  Ronal Rav, PharmD []  Rocky Slade, PharmD []  Bard Jeans, PharmD   Positive urine culture Treated with Cephalexin , organism sensitive to the same and no further patient follow-up is required at this time.  Clotilda BROCKS Christiana Gurevich 09/11/2024, 11:30 AM

## 2024-09-30 ENCOUNTER — Other Ambulatory Visit: Payer: Self-pay | Admitting: Physician Assistant
